# Patient Record
Sex: Female | Born: 1975 | Race: White | Hispanic: No | Marital: Single | Smoking: Never smoker
Health system: Southern US, Community
[De-identification: ages and names within clinical notes are randomized; demographics above are authoritative.]

## PROBLEM LIST (undated history)

## (undated) DIAGNOSIS — G43909 Migraine, unspecified, not intractable, without status migrainosus: Secondary | ICD-10-CM

## (undated) DIAGNOSIS — F419 Anxiety disorder, unspecified: Secondary | ICD-10-CM

## (undated) DIAGNOSIS — K219 Gastro-esophageal reflux disease without esophagitis: Secondary | ICD-10-CM

## (undated) HISTORY — DX: Gastro-esophageal reflux disease without esophagitis: K21.9

## (undated) HISTORY — DX: Migraine, unspecified, not intractable, without status migrainosus: G43.909

## (undated) HISTORY — DX: Anxiety disorder, unspecified: F41.9

---

## 1997-10-30 ENCOUNTER — Emergency Department (HOSPITAL_COMMUNITY): Admission: EM | Admit: 1997-10-30 | Discharge: 1997-10-30 | Payer: Self-pay | Admitting: Emergency Medicine

## 2010-12-24 ENCOUNTER — Emergency Department (HOSPITAL_COMMUNITY)
Admission: EM | Admit: 2010-12-24 | Discharge: 2010-12-25 | Disposition: A | Discharge status: Home / Self Care | Payer: BC Managed Care – PPO | Attending: Emergency Medicine | Admitting: Emergency Medicine

## 2010-12-24 ENCOUNTER — Encounter: Payer: Self-pay | Admitting: *Deleted

## 2010-12-24 ENCOUNTER — Other Ambulatory Visit: Payer: Self-pay

## 2010-12-24 ENCOUNTER — Emergency Department (HOSPITAL_COMMUNITY): Payer: BC Managed Care – PPO

## 2010-12-24 DIAGNOSIS — R109 Unspecified abdominal pain: Secondary | ICD-10-CM | POA: Insufficient documentation

## 2010-12-24 DIAGNOSIS — R059 Cough, unspecified: Secondary | ICD-10-CM | POA: Insufficient documentation

## 2010-12-24 DIAGNOSIS — R509 Fever, unspecified: Secondary | ICD-10-CM | POA: Insufficient documentation

## 2010-12-24 DIAGNOSIS — R339 Retention of urine, unspecified: Secondary | ICD-10-CM | POA: Insufficient documentation

## 2010-12-24 DIAGNOSIS — R05 Cough: Secondary | ICD-10-CM | POA: Insufficient documentation

## 2010-12-24 DIAGNOSIS — R5381 Other malaise: Secondary | ICD-10-CM | POA: Insufficient documentation

## 2010-12-24 DIAGNOSIS — J3489 Other specified disorders of nose and nasal sinuses: Secondary | ICD-10-CM | POA: Insufficient documentation

## 2010-12-24 DIAGNOSIS — R5383 Other fatigue: Secondary | ICD-10-CM | POA: Insufficient documentation

## 2010-12-24 DIAGNOSIS — R079 Chest pain, unspecified: Secondary | ICD-10-CM | POA: Insufficient documentation

## 2010-12-24 DIAGNOSIS — N390 Urinary tract infection, site not specified: Secondary | ICD-10-CM

## 2010-12-24 LAB — URINALYSIS, ROUTINE W REFLEX MICROSCOPIC
Bilirubin Urine: NEGATIVE
Glucose, UA: NEGATIVE mg/dL
Ketones, ur: NEGATIVE mg/dL
Protein, ur: NEGATIVE mg/dL
Urobilinogen, UA: 0.2 mg/dL (ref 0.0–1.0)

## 2010-12-24 LAB — URINE MICROSCOPIC-ADD ON

## 2010-12-24 LAB — BASIC METABOLIC PANEL
BUN: 15 mg/dL (ref 6–23)
Creatinine, Ser: 0.7 mg/dL (ref 0.50–1.10)
GFR calc non Af Amer: >90 mL/min (ref >90)
Glucose, Bld: 119 mg/dL — ABNORMAL HIGH (ref 70–99)
Potassium: 3.8 mEq/L (ref 3.5–5.1)

## 2010-12-24 LAB — CBC
HCT: 41.7 % (ref 36.0–46.0)
Hemoglobin: 14.5 g/dL (ref 12.0–15.0)
MCHC: 34.8 g/dL (ref 30.0–36.0)
MCV: 89.9 fL (ref 78.0–100.0)
RDW: 12.6 % (ref 11.5–15.5)

## 2010-12-24 LAB — POCT PREGNANCY, URINE: Preg Test, Ur: NEGATIVE

## 2010-12-24 LAB — POCT I-STAT TROPONIN I: Troponin i, poc: 0 ng/mL (ref 0.00–0.08)

## 2010-12-24 MED ORDER — SODIUM CHLORIDE 0.9 % IV BOLUS (SEPSIS)
1000.0000 mL | Freq: Once | INTRAVENOUS | Status: AC
  Administered 2010-12-24: 1000 mL via INTRAVENOUS

## 2010-12-24 NOTE — ED Notes (Signed)
The pt has had lower back pain also and is having some now.  She has had this period for the past 2 weeks

## 2010-12-24 NOTE — ED Notes (Signed)
The pt says she had the flu 3 weeks ago and today while driving home she became too weak to drive.  She has some chest pain with coughing and she says she has no energy.  She keeps her eyes cl;osed when shes talking.

## 2010-12-24 NOTE — ED Provider Notes (Signed)
  I performed a history and physical examination of Frances Horn and discussed her management with Dr. Gwendolyn Grant.  I agree with the history, physical, assessment, and plan of care, with the following exceptions: None  The patient presents for evaluation of general malaise and myalgias along with some chest discomfort and occasional cough. On evaluation she appears mildly uncomfortable, fatigue, with dry mucous membranes and apparent dehydration. Her lung sounds are clear in all fields with good air exchange, and her heart sounds are normal with regular rate and rhythm. Her abdomen is soft and nontender to palpation with normal bowel sounds and no rebound or guarding.  I was present for the following procedures: None Time Spent in Critical Care of the patient: None Time spent in discussions with the patient and family: 5 minutes  Frances Horn   Felisa Bonier, MD 12/24/10 2357

## 2010-12-24 NOTE — ED Notes (Signed)
Pt also c/o having her menses for the past 2 months.

## 2010-12-24 NOTE — ED Provider Notes (Signed)
History     CSN: [id number]  Arrival date & time 12/24/10  9528   First MD Initiated Contact with Patient 12/24/10 1915      Chief Complaint  Patient presents with  . Weakness  . Urinary Retention    (Consider location/radiation/quality/duration/timing/severity/associated sxs/prior treatment) HPI Comments: Recent bout of flu and bronchitis. States urinary retention and suprapubic discomfort today.  Recent treatment of sinus infection with Levaquin and Prednisone.  Patient is a 35 y.o. female presenting with weakness and general illness. The history is provided by the patient. No language interpreter was used.  Weakness The primary symptoms include fever. Primary symptoms do not include headaches, syncope, nausea or vomiting. The symptoms began more than 1 week ago. The symptoms are unchanged. The neurological symptoms are focal. Context: No specific context.  Additional symptoms include weakness (generalized). Additional symptoms do not include pain or vertigo.  Illness  Associated symptoms include a fever, congestion (previous, resovled now) and cough. Pertinent negatives include no abdominal pain, no nausea, no vomiting, no headaches, no rhinorrhea, no sore throat and no rash.    Past Medical History  Diagnosis Date  . Asthma     No past surgical history on file.  No family history on file.  History  Substance Use Topics  . Smoking status: Not on file  . Smokeless tobacco: Not on file  . Alcohol Use:     OB History    Grav Para Term Preterm Abortions TAB SAB Ect Mult Living                  Review of Systems  Constitutional: Positive for fever and chills.  HENT: Positive for congestion (previous, resovled now). Negative for sore throat, rhinorrhea and trouble swallowing.   Respiratory: Positive for cough. Negative for shortness of breath.   Cardiovascular: Positive for chest pain (pressure/burnig sensation described in entire chest). Negative for syncope.    Gastrointestinal: Negative for nausea, vomiting and abdominal pain.  Genitourinary: Negative for dysuria and frequency.       Mild urinary retention today  Skin: Negative for rash.  Neurological: Positive for weakness (generalized). Negative for vertigo and headaches.  All other systems reviewed and are negative.    Allergies  Erythromycin and Nabumetone  Home Medications   Current Outpatient Rx  Name Route Sig Dispense Refill  . CALCIUM CARBONATE ANTACID 500 MG PO CHEW Oral Chew 2 tablets by mouth daily.      Marland Kitchen TAGAMET PO Oral Take 1 tablet by mouth daily as needed. For heartburn     . DIPHENHYDRAMINE-APAP (SLEEP) 25-500 MG PO TABS Oral Take 2 tablets by mouth at bedtime as needed. For pain/sleep     . HYDROCODONE-HOMATROPINE 5-1.5 MG/5ML PO SYRP Oral Take 5 mLs by mouth every 6 (six) hours as needed. For cough     . LEVOFLOXACIN 250 MG PO TABS Oral Take 750 mg by mouth daily.      Marland Kitchen PREDNISONE 10 MG PO TABS Oral Take 10-40 mg by mouth daily. Take 40 mg for 2 days,30 mg for 2 days, 20 mg for 2 days, then 10 mg for 2 days then stop. Started 12/22/2010     . PSEUDOEPHEDRINE-GUAIFENESIN 60-600 MG PO TB12 Oral Take 1 tablet by mouth every 12 (twelve) hours.        BP 122/78  Pulse 99  Temp(Src) 98.9 F (37.2 C) (Oral)  SpO2 96%  Physical Exam  Nursing note and vitals reviewed. Constitutional: She is oriented to person,  place, and time. She appears well-developed and well-nourished. No distress.  HENT:  Head: Normocephalic and atraumatic.  Eyes: EOM are normal. Pupils are equal, round, and reactive to light.  Neck: Normal range of motion. Neck supple.  Cardiovascular: Normal rate and regular rhythm.  Exam reveals no friction rub.   No murmur heard. Pulmonary/Chest: Effort normal and breath sounds normal. No respiratory distress. She has no wheezes. She has no rales.  Abdominal: Soft. She exhibits no distension. There is no tenderness. There is no rebound.  Musculoskeletal:  Normal range of motion. She exhibits no edema.  Neurological: She is alert and oriented to person, place, and time.  Skin: She is not diaphoretic.   EKG with normal sinus rhythm, no concern for STEMI.  ED Course  Procedures (including critical care time)  Labs Reviewed  URINALYSIS, ROUTINE W REFLEX MICROSCOPIC - Abnormal; Notable for the following:    pH 8.5 (*)    Hgb urine dipstick TRACE (*)    Leukocytes, UA TRACE (*)    All other components within normal limits  CBC - Abnormal; Notable for the following:    WBC 13.5 (*)    All other components within normal limits  BASIC METABOLIC PANEL - Abnormal; Notable for the following:    Sodium 134 (*)    Glucose, Bld 119 (*)    All other components within normal limits  POCT PREGNANCY, URINE  URINE MICROSCOPIC-ADD ON  POCT I-STAT TROPONIN I  POCT PREGNANCY, URINE  GRAM STAIN  URINE CULTURE  MONONUCLEOSIS SCREEN  I-STAT TROPONIN I   Dg Chest 2 View  12/24/2010  *RADIOLOGY REPORT*  Clinical Data: Shortness of breath.  Cough.  Malaise.  Asthma.  CHEST - 2 VIEW  Comparison:  None.  Findings:  The heart size and mediastinal contours are within normal limits.  Both lungs are clear.  The visualized skeletal structures are unremarkable.  IMPRESSION: No active cardiopulmonary disease.  Original Report Authenticated By: Danae Orleans, M.D.     1. UTI (lower urinary tract infection)   2. Malaise       MDM  38F p/w urinary retention and general malaise. Has had recent bout of flu, sinusitis, and bronchitis. Recently given Levaquin and prednisone for sinusitis. Continued generalized weakness. Today having urinary retention and mild suprapubic pain. Denies N/V, cough. Associated burning/pressure like chest pain, dizziness. Afebrile for past several days. AFVSS on arrival. Exam with mild suprapubic abdominal tenderness, otherwise normal. No concern for otitis, sinusitis. Lungs clear, heart sounds normal.  Labs sent, fluids given. CXR  negative for PNA. Patient's labs show mild UTI, however on repeat exam continued pain and ill-appearance. EKG and troponin checked to rule out possible myocarditis. Repeat IVF bolus given. EKG with NSR, no ischemic changes. Troponin negative.  UTI present on labs, will give Rx for bactrim and discharge home with instructions to f/u with PCP for repeat exam and for monospot results.        Elwin Mocha, MD 12/25/10 [phone]

## 2010-12-24 NOTE — ED Notes (Signed)
The pt is c/o an inability to void for the past 6 hours  Except for small dribbles

## 2010-12-24 NOTE — ED Notes (Signed)
To ed for eval of general weakness and difficulty with urination. States she had flu and bronchitis over the past cple weeks. Urinary symptoms started today

## 2010-12-24 NOTE — ED Notes (Signed)
EKG TAKEN-NO OLD EKG, NEW EKG GIVEN TO DR Gwendolyn Grant

## 2010-12-25 MED ORDER — SULFAMETHOXAZOLE-TRIMETHOPRIM 800-160 MG PO TABS: 1.0000 | ORAL_TABLET | Freq: Two times a day (BID) | ORAL | Status: AC

## 2010-12-25 MED ORDER — SULFAMETHOXAZOLE-TMP DS 800-160 MG PO TABS
1.0000 | ORAL_TABLET | Freq: Once | ORAL | Status: AC
  Administered 2010-12-25: 1 via ORAL
  Filled 2010-12-25: qty 1

## 2010-12-29 NOTE — ED Provider Notes (Signed)
Evaluation and management procedures were performed by the resident physician under my supervision/collaboration.  I evaluated this patient face-to-face at the time of encounter.  Please see my note dated at that time.  Felisa Bonier, MD 12/29/10 1520

## 2012-08-31 IMAGING — CR DG CHEST 2V
2 series · 2 of 2 positions shown · non-contrast
Comparison: None.

CLINICAL DATA: Shortness of breath.  Cough.  Malaise.  Asthma.

CHEST - 2 VIEW

[w chest pa]
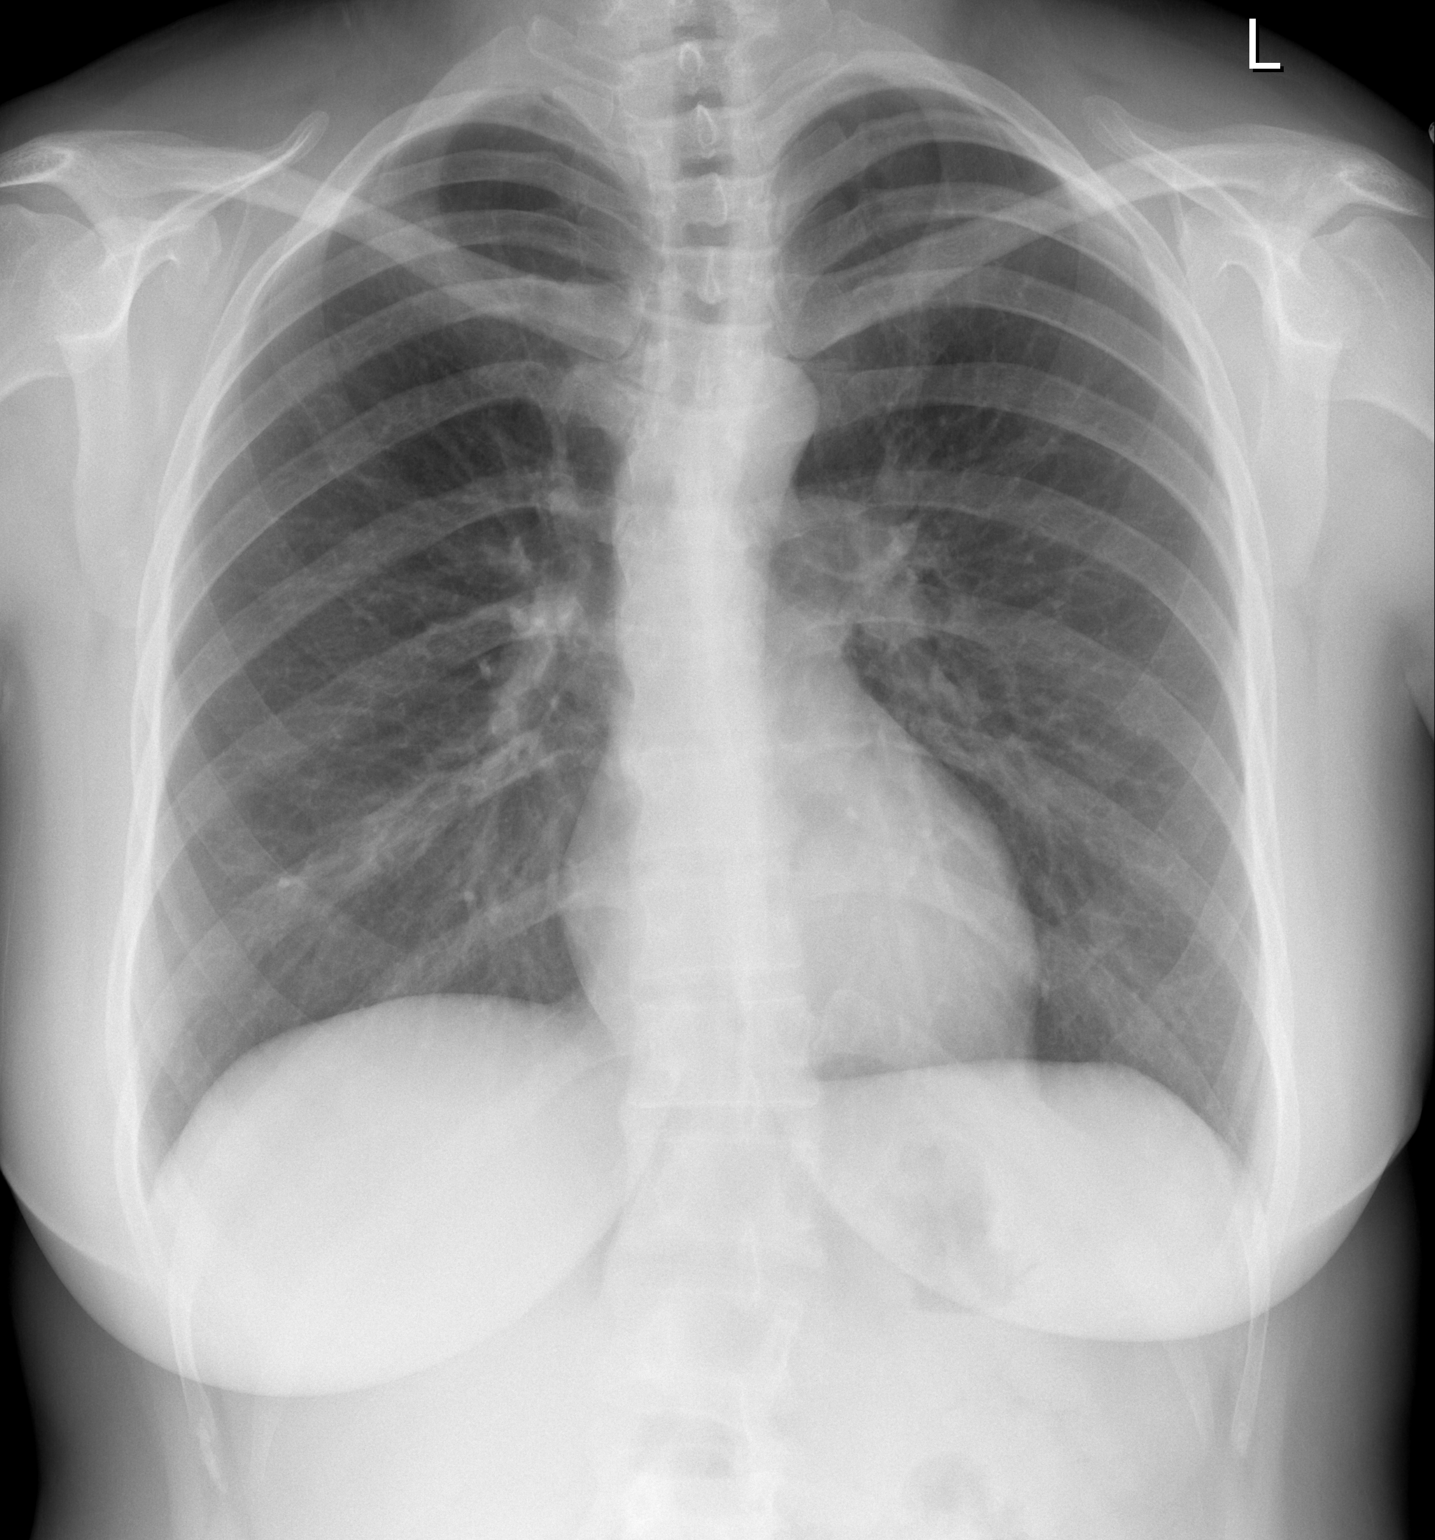

[w chest lat]
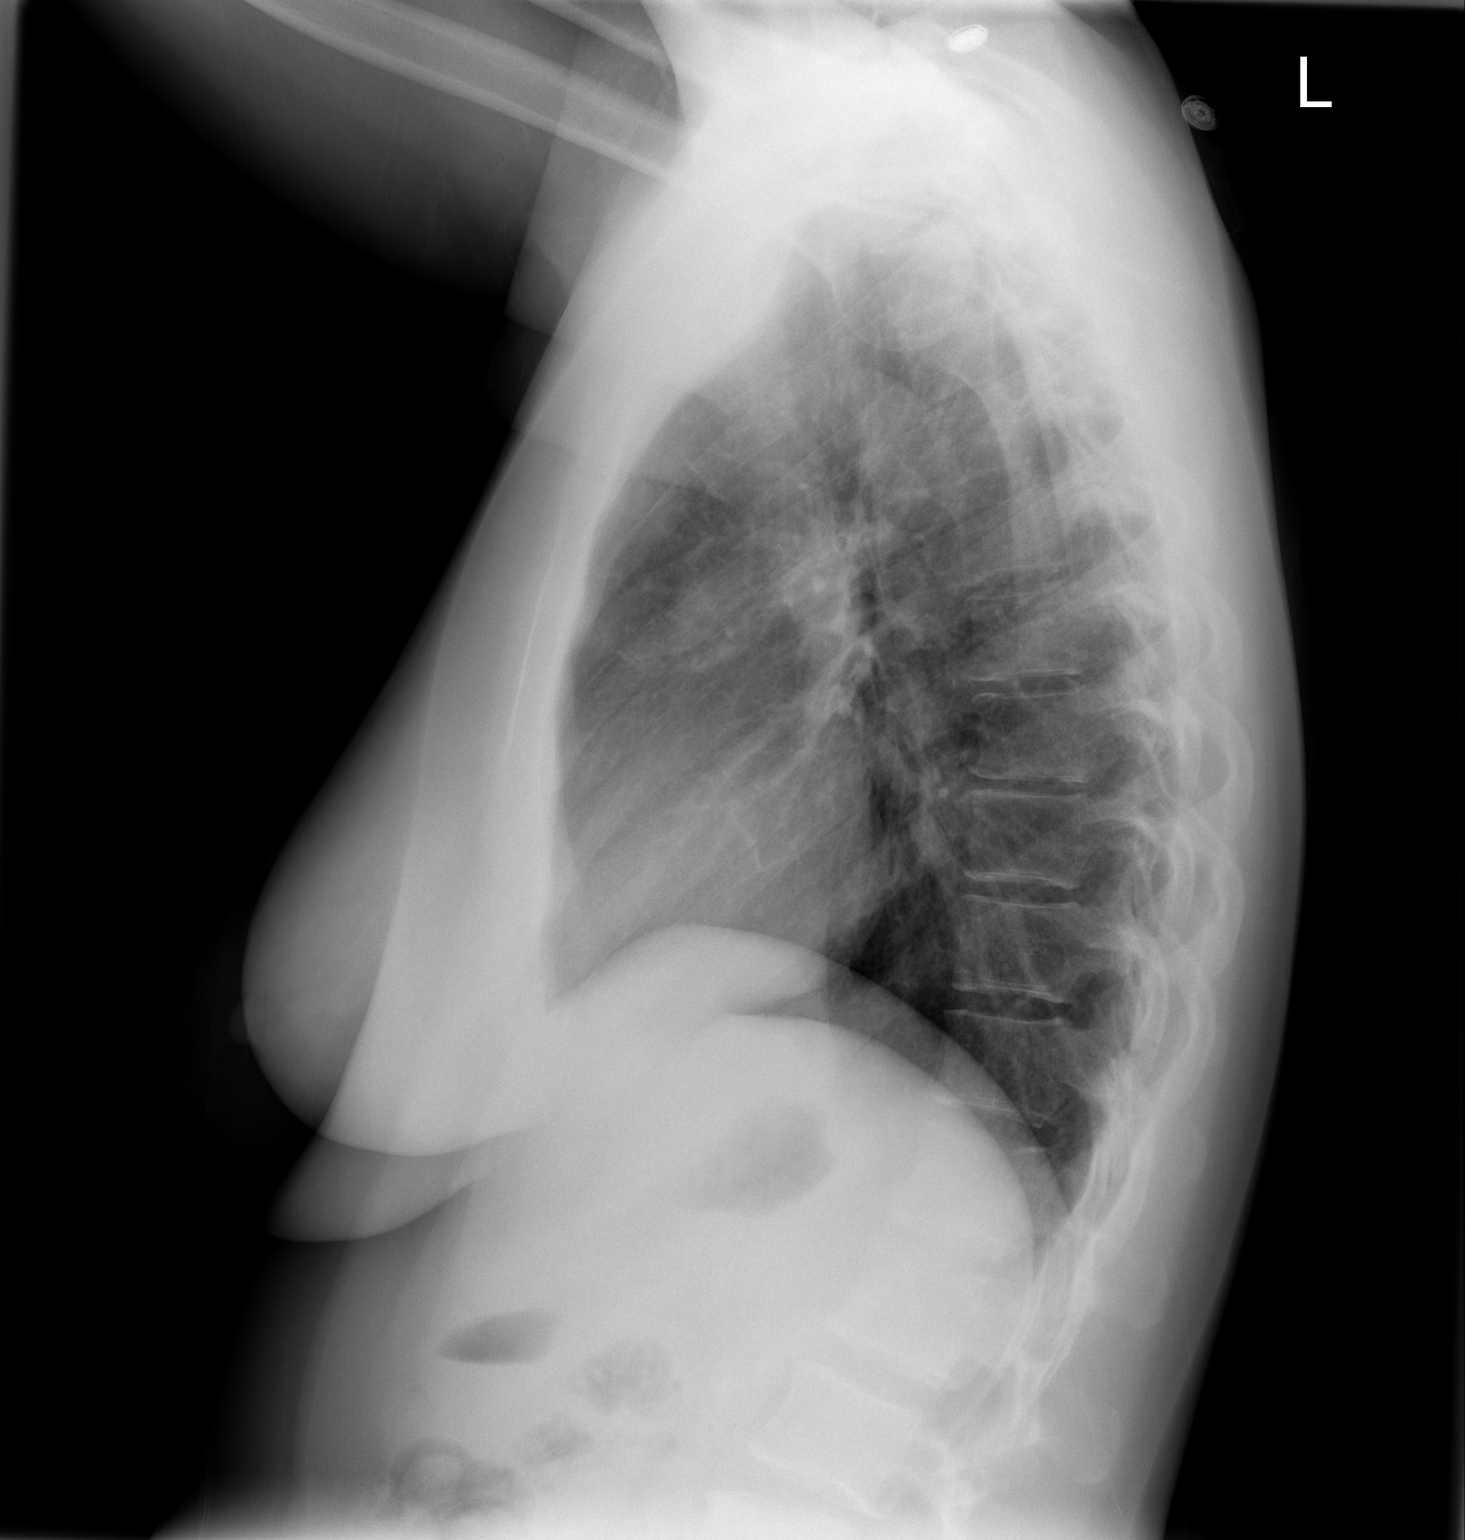

[2 of 2 positions shown; findings below may reference images not displayed]

FINDINGS: The heart size and mediastinal contours are within
normal limits.  Both lungs are clear.  The visualized skeletal
structures are unremarkable.
IMPRESSION: No active cardiopulmonary disease.

## 2014-10-01 DIAGNOSIS — Z91018 Allergy to other foods: Secondary | ICD-10-CM

## 2014-10-01 DIAGNOSIS — J309 Allergic rhinitis, unspecified: Secondary | ICD-10-CM | POA: Insufficient documentation

## 2014-10-01 DIAGNOSIS — J45909 Unspecified asthma, uncomplicated: Secondary | ICD-10-CM | POA: Insufficient documentation

## 2015-04-09 ENCOUNTER — Encounter: Payer: Self-pay | Admitting: Allergy and Immunology

## 2015-04-09 ENCOUNTER — Ambulatory Visit (INDEPENDENT_AMBULATORY_CARE_PROVIDER_SITE_OTHER): Payer: BLUE CROSS/BLUE SHIELD | Admitting: Allergy and Immunology

## 2015-04-09 VITALS — BP 94/72 | HR 72 | Resp 18 | Ht 60.43 in | Wt 187.4 lb

## 2015-04-09 DIAGNOSIS — J4520 Mild intermittent asthma, uncomplicated: Secondary | ICD-10-CM

## 2015-04-09 DIAGNOSIS — J309 Allergic rhinitis, unspecified: Secondary | ICD-10-CM

## 2015-04-09 DIAGNOSIS — J019 Acute sinusitis, unspecified: Secondary | ICD-10-CM | POA: Diagnosis not present

## 2015-04-09 DIAGNOSIS — H1010 Acute atopic conjunctivitis, unspecified eye: Secondary | ICD-10-CM

## 2015-04-09 NOTE — Progress Notes (Signed)
Follow-up Note  Referring Provider: No ref. provider found Primary Provider: Paulina Fusi, MD Date of Office Visit: 04/09/2015  Subjective:   Frances Horn (DOB: [birth year]) is a 40 y.o. female who [facility] in re-evaluation of the following:  HPI Comments: Frances Horn presents this clinic in evaluation of her asthma and allergic rhinitis. She stopped all of her medications including her Asmanex and Nasonex after resolution of last spring and is done very well for the remainder of the year without any episodes of significant respiratory tract symptoms other than one episode of "bronchitis" associated with a fever requiring her to get an antibiotic and a steroid shot in November. She can exercise without any difficulty and does not use a short acting bronchodilator.  Unfortunately, this weekend she had rather acute onset of headache and clear rhinorrhea and head fullness and sneezing with slight cough that did not respond to her usual antihistamines and decongestants. Her nose stop running yesterday and now she just has lots of postnasal drip. Her airway feels very raw. Her daughters at home with a similar type of issue.     Medication List           ALPRAZolam 0.25 MG tablet  Commonly known as:  XANAX  Take 0.25 mg by mouth at bedtime as needed for anxiety.     calcium carbonate 500 MG chewable tablet  Commonly known as:  TUMS - dosed in mg elemental calcium  Chew 2 tablets by mouth daily. Reported on 04/09/2015     CELEXA PO  Take 1 tablet by mouth daily.     cetirizine 10 MG tablet  Commonly known as:  ZYRTEC  Take 10 mg by mouth daily as needed for allergies. Reported on 04/09/2015     diphenhydramine-acetaminophen 25-500 MG Tabs tablet  Commonly known as:  TYLENOL PM  Take 2 tablets by mouth at bedtime as needed. Reported on 04/09/2015     EPIPEN 2-PAK 0.3 mg/0.3 mL Soaj injection  Generic drug:  EPINEPHrine  Inject 0.3 mg into  the muscle once.     omeprazole 40 MG capsule  Commonly known as:  PRILOSEC  Take 40 mg by mouth daily.     ondansetron 4 MG tablet  Commonly known as:  ZOFRAN  Take 4 mg by mouth every 8 (eight) hours as needed for nausea or vomiting. Reported on 04/09/2015     pseudoephedrine-guaifenesin 60-600 MG 12 hr tablet  Commonly known as:  MUCINEX D  Take 1 tablet by mouth every 12 (twelve) hours. Reported on 04/09/2015     rizatriptan 10 MG tablet  Commonly known as:  MAXALT  Take 10 mg by mouth as needed for migraine. May repeat in 2 hours if needed     TOPAMAX 50 MG tablet  Generic drug:  topiramate  Take 50 mg by mouth 2 (two) times daily.     VENTOLIN HFA 108 (90 Base) MCG/ACT inhaler  Generic drug:  albuterol  Inhale 2 puffs into the lungs every 6 (six) hours as needed for wheezing or shortness of breath. Reported on 04/09/2015        Past Medical History  Diagnosis Date  . Asthma   . Migraine   . GERD (gastroesophageal reflux disease)   . Anxiety     History reviewed. No pertinent past surgical history.  Allergies  Allergen Reactions  . Erythromycin Other (See Comments)    Stomach cramps  . Nabumetone Other (  See Comments)    Couldn't breath    Review of systems negative except as noted in HPI / PMHx or noted below:  Review of Systems  Constitutional: Negative.   HENT: Negative.   Eyes: Negative.   Respiratory: Negative.   Cardiovascular: Negative.   Gastrointestinal: Negative.   Genitourinary: Negative.   Musculoskeletal: Negative.   Skin: Negative.   Neurological: Negative.   Endo/Heme/Allergies: Negative.   Psychiatric/Behavioral: Negative.      Objective:   Filed Vitals:   04/09/15 1151  BP: 94/72  Pulse: 72  Resp: 18   Height: 5' 0.43" (153.5 cm)  Weight: 187 lb 6.3 oz (85 kg)   Physical Exam  Constitutional: She is well-developed, well-nourished, and in no distress.  HENT:  Head: Normocephalic.  Right Ear: Tympanic membrane, external ear  and ear canal normal.  Left Ear: Tympanic membrane, external ear and ear canal normal.  Nose: Mucosal edema (Erythematous) present. No rhinorrhea.  Mouth/Throat: Uvula is midline, oropharynx is clear and moist and mucous membranes are normal. No oropharyngeal exudate.  Eyes: Conjunctivae are normal.  Neck: Trachea normal. No tracheal tenderness present. No tracheal deviation present. No thyromegaly present.  Cardiovascular: Normal rate, regular rhythm, S1 normal, S2 normal and normal heart sounds.   No murmur heard. Pulmonary/Chest: Breath sounds normal. No stridor. No respiratory distress. She has no wheezes. She has no rales.  Musculoskeletal: She exhibits no edema.  Lymphadenopathy:       Head (right side): No tonsillar adenopathy present.       Head (left side): No tonsillar adenopathy present.    She has no cervical adenopathy.  Neurological: She is alert. Gait normal.  Skin: No rash noted. She is not diaphoretic. No erythema. Nails show no clubbing.  Psychiatric: Mood and affect normal.    Diagnostics:    Spirometry was performed and demonstrated an FEV1 of 2.45 at 89 % of predicted.  The patient had an Asthma Control Test with the following results: ACT Total Score: 24.    Assessment and Plan:   1. Asthma, mild intermittent, well-controlled   2. Allergic rhinoconjunctivitis   3. Acute sinusitis, recurrence not specified, unspecified location      1. Prednisone 10 mg one tablet once a day for 4 days only  2. Nasal saline multiple times a day while sick  3. OTC antihistamine - Claritin/Allegra/Zyrtec  4. Over-the-counter Mucinex DM 2 tablets twice a day  5. Ventolin HFA 2 puffs every 4-6 hours if needed. EpiPen if needed  6. OTC Rhinocort one spray each nostril one time per day. Coupon  7. Further treatment for prevention of asthma through the springtime?  8. Contact clinic if plan does not work over the course of the next several days  9. Return to clinic in 6  months or earlier if problem    It appears that Frances Horn has contracted a viral upper respiratory tract infection which I will address with the therapy mentioned above. She will keep in contact with me noting her response as we move forward. Whether or not she is going to require preventative medications for asthma through the spring is still an open question. I've asked her to contact me should she have significant problems while utilizing the plan as we move through the spring. I will see her back in this clinic in 6 months or earlier if there is a problem.  Laurette SchimkeEric Kozlow, MD [facility]

## 2015-04-09 NOTE — Patient Instructions (Addendum)
  1. Prednisone 10 mg one tablet once a day for 4 days only  2. Nasal saline multiple times a day while sick  3. OTC antihistamine - Claritin/Allegra/Zyrtec  4. Over-the-counter Mucinex DM 2 tablets twice a day  5. Ventolin HFA 2 puffs every 4-6 hours if needed. EpiPen if needed  6. OTC Rhinocort one spray each nostril one time per day. Coupon  7. Further treatment for prevention of asthma through the springtime?  8. Contact clinic if plan does not work over the course of the next several days  9. Return to clinic in 6 months or earlier if problem

## 2015-05-10 DIAGNOSIS — J45909 Unspecified asthma, uncomplicated: Secondary | ICD-10-CM | POA: Diagnosis not present

## 2015-05-10 DIAGNOSIS — J019 Acute sinusitis, unspecified: Secondary | ICD-10-CM | POA: Diagnosis not present

## 2015-05-10 DIAGNOSIS — J309 Allergic rhinitis, unspecified: Secondary | ICD-10-CM | POA: Diagnosis not present

## 2015-05-10 DIAGNOSIS — J209 Acute bronchitis, unspecified: Secondary | ICD-10-CM | POA: Diagnosis not present

## 2015-05-13 DIAGNOSIS — G43009 Migraine without aura, not intractable, without status migrainosus: Secondary | ICD-10-CM | POA: Diagnosis not present

## 2015-05-13 DIAGNOSIS — Z6834 Body mass index (BMI) 34.0-34.9, adult: Secondary | ICD-10-CM | POA: Diagnosis not present

## 2015-05-13 DIAGNOSIS — G43719 Chronic migraine without aura, intractable, without status migrainosus: Secondary | ICD-10-CM | POA: Diagnosis not present

## 2015-06-10 DIAGNOSIS — K5732 Diverticulitis of large intestine without perforation or abscess without bleeding: Secondary | ICD-10-CM | POA: Diagnosis not present

## 2015-06-10 DIAGNOSIS — Z79899 Other long term (current) drug therapy: Secondary | ICD-10-CM | POA: Diagnosis not present

## 2015-06-10 DIAGNOSIS — K219 Gastro-esophageal reflux disease without esophagitis: Secondary | ICD-10-CM | POA: Diagnosis not present

## 2015-06-10 DIAGNOSIS — F419 Anxiety disorder, unspecified: Secondary | ICD-10-CM | POA: Diagnosis not present

## 2015-06-10 DIAGNOSIS — K5792 Diverticulitis of intestine, part unspecified, without perforation or abscess without bleeding: Secondary | ICD-10-CM | POA: Diagnosis not present

## 2015-06-11 DIAGNOSIS — Z6836 Body mass index (BMI) 36.0-36.9, adult: Secondary | ICD-10-CM | POA: Diagnosis not present

## 2015-06-11 DIAGNOSIS — K5792 Diverticulitis of intestine, part unspecified, without perforation or abscess without bleeding: Secondary | ICD-10-CM | POA: Diagnosis not present

## 2015-06-13 DIAGNOSIS — K5792 Diverticulitis of intestine, part unspecified, without perforation or abscess without bleeding: Secondary | ICD-10-CM | POA: Diagnosis not present

## 2015-06-13 DIAGNOSIS — Z6837 Body mass index (BMI) 37.0-37.9, adult: Secondary | ICD-10-CM | POA: Diagnosis not present

## 2015-06-13 DIAGNOSIS — R1084 Generalized abdominal pain: Secondary | ICD-10-CM | POA: Diagnosis not present

## 2015-06-13 DIAGNOSIS — R109 Unspecified abdominal pain: Secondary | ICD-10-CM | POA: Diagnosis not present

## 2015-06-13 DIAGNOSIS — E669 Obesity, unspecified: Secondary | ICD-10-CM | POA: Diagnosis not present

## 2015-06-13 DIAGNOSIS — K5732 Diverticulitis of large intestine without perforation or abscess without bleeding: Secondary | ICD-10-CM | POA: Diagnosis not present

## 2015-06-17 DIAGNOSIS — R1032 Left lower quadrant pain: Secondary | ICD-10-CM | POA: Diagnosis not present

## 2015-06-18 DIAGNOSIS — K591 Functional diarrhea: Secondary | ICD-10-CM | POA: Diagnosis not present

## 2015-06-24 DIAGNOSIS — Z01419 Encounter for gynecological examination (general) (routine) without abnormal findings: Secondary | ICD-10-CM | POA: Diagnosis not present

## 2015-06-24 DIAGNOSIS — Z124 Encounter for screening for malignant neoplasm of cervix: Secondary | ICD-10-CM | POA: Diagnosis not present

## 2015-07-02 DIAGNOSIS — R1032 Left lower quadrant pain: Secondary | ICD-10-CM | POA: Diagnosis not present

## 2015-07-31 DIAGNOSIS — K5792 Diverticulitis of intestine, part unspecified, without perforation or abscess without bleeding: Secondary | ICD-10-CM | POA: Diagnosis not present

## 2015-07-31 DIAGNOSIS — R197 Diarrhea, unspecified: Secondary | ICD-10-CM | POA: Diagnosis not present

## 2015-07-31 DIAGNOSIS — K5730 Diverticulosis of large intestine without perforation or abscess without bleeding: Secondary | ICD-10-CM | POA: Diagnosis not present

## 2015-07-31 DIAGNOSIS — K449 Diaphragmatic hernia without obstruction or gangrene: Secondary | ICD-10-CM | POA: Diagnosis not present

## 2015-07-31 DIAGNOSIS — R933 Abnormal findings on diagnostic imaging of other parts of digestive tract: Secondary | ICD-10-CM | POA: Diagnosis not present

## 2015-07-31 DIAGNOSIS — K219 Gastro-esophageal reflux disease without esophagitis: Secondary | ICD-10-CM | POA: Diagnosis not present

## 2015-08-31 ENCOUNTER — Telehealth: Payer: Self-pay | Admitting: Allergy and Immunology

## 2015-09-16 DIAGNOSIS — K5730 Diverticulosis of large intestine without perforation or abscess without bleeding: Secondary | ICD-10-CM | POA: Diagnosis not present

## 2015-11-23 DIAGNOSIS — J019 Acute sinusitis, unspecified: Secondary | ICD-10-CM | POA: Diagnosis not present

## 2015-12-30 DIAGNOSIS — G43119 Migraine with aura, intractable, without status migrainosus: Secondary | ICD-10-CM | POA: Diagnosis not present

## 2016-01-29 DIAGNOSIS — J208 Acute bronchitis due to other specified organisms: Secondary | ICD-10-CM | POA: Diagnosis not present

## 2016-01-29 DIAGNOSIS — Z6835 Body mass index (BMI) 35.0-35.9, adult: Secondary | ICD-10-CM | POA: Diagnosis not present

## 2016-01-29 DIAGNOSIS — R6889 Other general symptoms and signs: Secondary | ICD-10-CM | POA: Diagnosis not present

## 2016-03-06 DIAGNOSIS — J019 Acute sinusitis, unspecified: Secondary | ICD-10-CM | POA: Diagnosis not present

## 2016-05-12 DIAGNOSIS — Z6834 Body mass index (BMI) 34.0-34.9, adult: Secondary | ICD-10-CM | POA: Diagnosis not present

## 2016-05-12 DIAGNOSIS — G43009 Migraine without aura, not intractable, without status migrainosus: Secondary | ICD-10-CM | POA: Diagnosis not present

## 2016-08-15 DIAGNOSIS — Z01419 Encounter for gynecological examination (general) (routine) without abnormal findings: Secondary | ICD-10-CM | POA: Diagnosis not present

## 2016-08-15 DIAGNOSIS — Z1231 Encounter for screening mammogram for malignant neoplasm of breast: Secondary | ICD-10-CM | POA: Diagnosis not present

## 2016-08-15 DIAGNOSIS — Z0000 Encounter for general adult medical examination without abnormal findings: Secondary | ICD-10-CM | POA: Diagnosis not present

## 2016-10-17 DIAGNOSIS — Z1389 Encounter for screening for other disorder: Secondary | ICD-10-CM | POA: Diagnosis not present

## 2016-10-17 DIAGNOSIS — Z6835 Body mass index (BMI) 35.0-35.9, adult: Secondary | ICD-10-CM | POA: Diagnosis not present

## 2016-10-17 DIAGNOSIS — F418 Other specified anxiety disorders: Secondary | ICD-10-CM | POA: Diagnosis not present

## 2016-10-17 DIAGNOSIS — J208 Acute bronchitis due to other specified organisms: Secondary | ICD-10-CM | POA: Diagnosis not present

## 2016-10-31 DIAGNOSIS — Z23 Encounter for immunization: Secondary | ICD-10-CM | POA: Diagnosis not present

## 2016-11-03 DIAGNOSIS — Z1231 Encounter for screening mammogram for malignant neoplasm of breast: Secondary | ICD-10-CM | POA: Diagnosis not present

## 2016-11-18 DIAGNOSIS — L989 Disorder of the skin and subcutaneous tissue, unspecified: Secondary | ICD-10-CM | POA: Diagnosis not present

## 2016-11-18 DIAGNOSIS — R0602 Shortness of breath: Secondary | ICD-10-CM | POA: Diagnosis not present

## 2016-11-18 DIAGNOSIS — Z6836 Body mass index (BMI) 36.0-36.9, adult: Secondary | ICD-10-CM | POA: Diagnosis not present

## 2016-11-18 DIAGNOSIS — J208 Acute bronchitis due to other specified organisms: Secondary | ICD-10-CM | POA: Diagnosis not present

## 2016-11-21 DIAGNOSIS — R0600 Dyspnea, unspecified: Secondary | ICD-10-CM | POA: Diagnosis not present

## 2016-11-22 DIAGNOSIS — R0602 Shortness of breath: Secondary | ICD-10-CM | POA: Diagnosis not present

## 2016-11-22 DIAGNOSIS — R791 Abnormal coagulation profile: Secondary | ICD-10-CM | POA: Diagnosis not present

## 2016-11-22 DIAGNOSIS — R079 Chest pain, unspecified: Secondary | ICD-10-CM | POA: Diagnosis not present

## 2016-11-22 DIAGNOSIS — K5730 Diverticulosis of large intestine without perforation or abscess without bleeding: Secondary | ICD-10-CM | POA: Diagnosis not present

## 2016-12-09 DIAGNOSIS — L918 Other hypertrophic disorders of the skin: Secondary | ICD-10-CM | POA: Diagnosis not present

## 2016-12-09 DIAGNOSIS — L720 Epidermal cyst: Secondary | ICD-10-CM | POA: Diagnosis not present

## 2017-03-04 DIAGNOSIS — K5732 Diverticulitis of large intestine without perforation or abscess without bleeding: Secondary | ICD-10-CM | POA: Diagnosis not present

## 2017-07-21 DIAGNOSIS — J209 Acute bronchitis, unspecified: Secondary | ICD-10-CM | POA: Diagnosis not present

## 2017-10-14 DIAGNOSIS — Z6837 Body mass index (BMI) 37.0-37.9, adult: Secondary | ICD-10-CM | POA: Diagnosis not present

## 2017-10-14 DIAGNOSIS — J208 Acute bronchitis due to other specified organisms: Secondary | ICD-10-CM | POA: Diagnosis not present

## 2017-11-07 DIAGNOSIS — Z1231 Encounter for screening mammogram for malignant neoplasm of breast: Secondary | ICD-10-CM | POA: Diagnosis not present

## 2017-11-16 DIAGNOSIS — R928 Other abnormal and inconclusive findings on diagnostic imaging of breast: Secondary | ICD-10-CM | POA: Diagnosis not present

## 2017-11-16 DIAGNOSIS — R922 Inconclusive mammogram: Secondary | ICD-10-CM | POA: Diagnosis not present

## 2017-12-11 DIAGNOSIS — Z01419 Encounter for gynecological examination (general) (routine) without abnormal findings: Secondary | ICD-10-CM | POA: Diagnosis not present

## 2017-12-11 DIAGNOSIS — Z0000 Encounter for general adult medical examination without abnormal findings: Secondary | ICD-10-CM | POA: Diagnosis not present

## 2017-12-22 DIAGNOSIS — R509 Fever, unspecified: Secondary | ICD-10-CM | POA: Diagnosis not present

## 2017-12-22 DIAGNOSIS — J0100 Acute maxillary sinusitis, unspecified: Secondary | ICD-10-CM | POA: Diagnosis not present

## 2018-01-23 DIAGNOSIS — R1011 Right upper quadrant pain: Secondary | ICD-10-CM | POA: Diagnosis not present

## 2018-01-23 DIAGNOSIS — J208 Acute bronchitis due to other specified organisms: Secondary | ICD-10-CM | POA: Diagnosis not present

## 2018-01-23 DIAGNOSIS — J019 Acute sinusitis, unspecified: Secondary | ICD-10-CM | POA: Diagnosis not present

## 2018-03-24 DIAGNOSIS — Z23 Encounter for immunization: Secondary | ICD-10-CM | POA: Diagnosis not present

## 2018-03-24 DIAGNOSIS — M79642 Pain in left hand: Secondary | ICD-10-CM | POA: Diagnosis not present

## 2018-03-24 DIAGNOSIS — R0781 Pleurodynia: Secondary | ICD-10-CM | POA: Diagnosis not present

## 2018-03-24 DIAGNOSIS — S60222A Contusion of left hand, initial encounter: Secondary | ICD-10-CM | POA: Diagnosis not present

## 2018-03-24 DIAGNOSIS — S022XXA Fracture of nasal bones, initial encounter for closed fracture: Secondary | ICD-10-CM | POA: Diagnosis not present

## 2018-03-24 DIAGNOSIS — R079 Chest pain, unspecified: Secondary | ICD-10-CM | POA: Diagnosis not present

## 2018-03-24 DIAGNOSIS — M25552 Pain in left hip: Secondary | ICD-10-CM | POA: Diagnosis not present

## 2018-03-24 DIAGNOSIS — R51 Headache: Secondary | ICD-10-CM | POA: Diagnosis not present

## 2018-03-24 DIAGNOSIS — S0121XA Laceration without foreign body of nose, initial encounter: Secondary | ICD-10-CM | POA: Diagnosis not present

## 2018-03-24 DIAGNOSIS — S6991XA Unspecified injury of right wrist, hand and finger(s), initial encounter: Secondary | ICD-10-CM | POA: Diagnosis not present

## 2018-03-24 DIAGNOSIS — S0031XA Abrasion of nose, initial encounter: Secondary | ICD-10-CM | POA: Diagnosis not present

## 2018-03-24 DIAGNOSIS — W010XXA Fall on same level from slipping, tripping and stumbling without subsequent striking against object, initial encounter: Secondary | ICD-10-CM | POA: Diagnosis not present

## 2018-03-24 DIAGNOSIS — S60221A Contusion of right hand, initial encounter: Secondary | ICD-10-CM | POA: Diagnosis not present

## 2018-03-26 DIAGNOSIS — S022XXA Fracture of nasal bones, initial encounter for closed fracture: Secondary | ICD-10-CM | POA: Diagnosis not present

## 2018-03-29 DIAGNOSIS — S022XXA Fracture of nasal bones, initial encounter for closed fracture: Secondary | ICD-10-CM | POA: Diagnosis not present

## 2018-03-29 DIAGNOSIS — M950 Acquired deformity of nose: Secondary | ICD-10-CM | POA: Diagnosis not present

## 2018-03-29 DIAGNOSIS — S0992XA Unspecified injury of nose, initial encounter: Secondary | ICD-10-CM | POA: Diagnosis not present

## 2018-03-29 DIAGNOSIS — J342 Deviated nasal septum: Secondary | ICD-10-CM | POA: Diagnosis not present

## 2018-04-04 DIAGNOSIS — Z79891 Long term (current) use of opiate analgesic: Secondary | ICD-10-CM | POA: Diagnosis not present

## 2018-04-04 DIAGNOSIS — S022XXA Fracture of nasal bones, initial encounter for closed fracture: Secondary | ICD-10-CM | POA: Diagnosis not present

## 2018-04-04 DIAGNOSIS — J342 Deviated nasal septum: Secondary | ICD-10-CM | POA: Diagnosis not present

## 2018-04-04 DIAGNOSIS — S0033XA Contusion of nose, initial encounter: Secondary | ICD-10-CM | POA: Diagnosis not present

## 2018-04-04 DIAGNOSIS — S0992XA Unspecified injury of nose, initial encounter: Secondary | ICD-10-CM | POA: Diagnosis not present

## 2018-04-04 DIAGNOSIS — J45909 Unspecified asthma, uncomplicated: Secondary | ICD-10-CM | POA: Diagnosis not present

## 2018-04-04 DIAGNOSIS — Z79899 Other long term (current) drug therapy: Secondary | ICD-10-CM | POA: Diagnosis not present

## 2018-04-04 DIAGNOSIS — J3489 Other specified disorders of nose and nasal sinuses: Secondary | ICD-10-CM | POA: Diagnosis not present

## 2018-04-04 DIAGNOSIS — K219 Gastro-esophageal reflux disease without esophagitis: Secondary | ICD-10-CM | POA: Diagnosis not present

## 2018-04-04 DIAGNOSIS — M950 Acquired deformity of nose: Secondary | ICD-10-CM | POA: Diagnosis not present

## 2018-06-06 DIAGNOSIS — R21 Rash and other nonspecific skin eruption: Secondary | ICD-10-CM | POA: Diagnosis not present

## 2018-10-11 DIAGNOSIS — J45909 Unspecified asthma, uncomplicated: Secondary | ICD-10-CM | POA: Diagnosis not present

## 2018-10-11 DIAGNOSIS — J4530 Mild persistent asthma, uncomplicated: Secondary | ICD-10-CM | POA: Diagnosis not present

## 2018-10-11 DIAGNOSIS — M940 Chondrocostal junction syndrome [Tietze]: Secondary | ICD-10-CM | POA: Diagnosis not present

## 2018-10-17 DIAGNOSIS — J4530 Mild persistent asthma, uncomplicated: Secondary | ICD-10-CM | POA: Diagnosis not present

## 2018-10-17 DIAGNOSIS — J019 Acute sinusitis, unspecified: Secondary | ICD-10-CM | POA: Diagnosis not present

## 2018-10-17 DIAGNOSIS — J309 Allergic rhinitis, unspecified: Secondary | ICD-10-CM | POA: Diagnosis not present

## 2018-10-17 DIAGNOSIS — B9689 Other specified bacterial agents as the cause of diseases classified elsewhere: Secondary | ICD-10-CM | POA: Diagnosis not present

## 2018-12-17 DIAGNOSIS — Z1231 Encounter for screening mammogram for malignant neoplasm of breast: Secondary | ICD-10-CM | POA: Diagnosis not present

## 2018-12-17 DIAGNOSIS — Z0000 Encounter for general adult medical examination without abnormal findings: Secondary | ICD-10-CM | POA: Diagnosis not present

## 2018-12-17 DIAGNOSIS — N816 Rectocele: Secondary | ICD-10-CM | POA: Diagnosis not present

## 2018-12-17 DIAGNOSIS — Z01419 Encounter for gynecological examination (general) (routine) without abnormal findings: Secondary | ICD-10-CM | POA: Diagnosis not present

## 2019-01-07 DIAGNOSIS — Z1231 Encounter for screening mammogram for malignant neoplasm of breast: Secondary | ICD-10-CM | POA: Diagnosis not present

## 2019-02-15 DIAGNOSIS — L720 Epidermal cyst: Secondary | ICD-10-CM | POA: Diagnosis not present

## 2019-03-25 DIAGNOSIS — H00024 Hordeolum internum left upper eyelid: Secondary | ICD-10-CM | POA: Diagnosis not present

## 2019-04-02 DIAGNOSIS — H00024 Hordeolum internum left upper eyelid: Secondary | ICD-10-CM | POA: Diagnosis not present

## 2019-04-29 DIAGNOSIS — H0014 Chalazion left upper eyelid: Secondary | ICD-10-CM | POA: Diagnosis not present

## 2019-08-27 DIAGNOSIS — Z20828 Contact with and (suspected) exposure to other viral communicable diseases: Secondary | ICD-10-CM | POA: Diagnosis not present

## 2019-08-27 DIAGNOSIS — J3489 Other specified disorders of nose and nasal sinuses: Secondary | ICD-10-CM | POA: Diagnosis not present

## 2019-09-11 DIAGNOSIS — Z20822 Contact with and (suspected) exposure to covid-19: Secondary | ICD-10-CM | POA: Diagnosis not present

## 2019-09-20 DIAGNOSIS — B9689 Other specified bacterial agents as the cause of diseases classified elsewhere: Secondary | ICD-10-CM | POA: Diagnosis not present

## 2019-09-20 DIAGNOSIS — J208 Acute bronchitis due to other specified organisms: Secondary | ICD-10-CM | POA: Diagnosis not present

## 2019-09-20 DIAGNOSIS — R6889 Other general symptoms and signs: Secondary | ICD-10-CM | POA: Diagnosis not present

## 2019-09-23 ENCOUNTER — Ambulatory Visit (HOSPITAL_COMMUNITY)
Admission: RE | Admit: 2019-09-23 | Discharge: 2019-09-23 | Disposition: A | Discharge status: Home / Self Care | Payer: BC Managed Care – PPO | Source: Ambulatory Visit | Attending: Pulmonary Disease | Admitting: Pulmonary Disease

## 2019-09-23 ENCOUNTER — Other Ambulatory Visit: Payer: Self-pay | Admitting: Oncology

## 2019-09-23 ENCOUNTER — Telehealth: Payer: Self-pay | Admitting: Oncology

## 2019-09-23 DIAGNOSIS — U071 COVID-19: Secondary | ICD-10-CM

## 2019-09-23 MED ORDER — ALBUTEROL SULFATE HFA 108 (90 BASE) MCG/ACT IN AERS: 2.0000 | INHALATION_SPRAY | Freq: Once | RESPIRATORY_TRACT | Status: DC | PRN

## 2019-09-23 MED ORDER — METHYLPREDNISOLONE SODIUM SUCC 125 MG IJ SOLR: 125.0000 mg | Freq: Once | INTRAMUSCULAR | Status: DC | PRN

## 2019-09-23 MED ORDER — SODIUM CHLORIDE 0.9 % IV SOLN: INTRAVENOUS | Status: DC | PRN

## 2019-09-23 MED ORDER — DIPHENHYDRAMINE HCL 50 MG/ML IJ SOLN: 50.0000 mg | Freq: Once | INTRAMUSCULAR | Status: DC | PRN

## 2019-09-23 MED ORDER — EPINEPHRINE 0.3 MG/0.3ML IJ SOAJ: 0.3000 mg | Freq: Once | INTRAMUSCULAR | Status: DC | PRN

## 2019-09-23 MED ORDER — SODIUM CHLORIDE 0.9 % IV SOLN
1200.0000 mg | Freq: Once | INTRAVENOUS | Status: AC
  Administered 2019-09-23: 1200 mg via INTRAVENOUS

## 2019-09-23 MED ORDER — FAMOTIDINE IN NACL 20-0.9 MG/50ML-% IV SOLN: 20.0000 mg | Freq: Once | INTRAVENOUS | Status: DC | PRN

## 2019-09-23 NOTE — Progress Notes (Signed)
  Diagnosis: COVID-19  Physician: Dr. Wright   Procedure: [facility] Med: casirivimab\imdevimab infusion - Provided patient with casirivimab\imdevimab fact sheet for patients, parents and caregivers prior to infusion.  Complications: No immediate complications noted.  Discharge: Discharged home   Lavante Toso  Bell 09/23/2019   

## 2019-09-23 NOTE — Telephone Encounter (Signed)
I connected by phone with  Frances Horn to discuss the potential use of an new treatment for mild to moderate COVID-19 viral infection in non-hospitalized patients.   This patient is a age/sex that meets the FDA criteria for Emergency Use Authorization of casirivimab\imdevimab.  Has a (+) direct SARS-CoV-2 viral test result 1. Has mild or moderate COVID-19  2. Is ? 44 years of age and weighs ? 40 kg 3. Is NOT hospitalized due to COVID-19 4. Is NOT requiring oxygen therapy or requiring an increase in baseline oxygen flow rate due to COVID-19 5. Is within 10 days of symptom onset 6. Has at least one of the high risk factor(s) for progression to severe COVID-19 and/or hospitalization as defined in EUA. Specific high risk criteria :  Past Medical History:  Diagnosis Date  . Anxiety   . Asthma   . GERD (gastroesophageal reflux disease)   . Migraine   ?   Symptom onset 09/18/2019.    I have spoken and communicated the following to the patient or parent/caregiver:   1. FDA has authorized the emergency use of casirivimab\imdevimab for the treatment of mild to moderate COVID-19 in adults and pediatric patients with positive results of direct SARS-CoV-2 viral testing who are 100 years of age and older weighing at least 40 kg, and who are at high risk for progressing to severe COVID-19 and/or hospitalization.   2. The significant known and potential risks and benefits of casirivimab\imdevimab, and the extent to which such potential risks and benefits are unknown.   3. Information on available alternative treatments and the risks and benefits of those alternatives, including clinical trials.   4. Patients treated with casirivimab\imdevimab should continue to self-isolate and use infection control measures (e.g., wear mask, isolate, social distance, avoid sharing personal items, clean and disinfect "high touch" surfaces, and frequent handwashing) according to CDC guidelines.    5. The patient or  parent/caregiver has the option to accept or refuse casirivimab\imdevimab .   After reviewing this information with the patient, The patient agreed to proceed with receiving casirivimab\imdevimab infusion and will be provided a copy of the Fact sheet prior to receiving the infusion.Frances Horn, AGNP-C [phone] (Infusion Center Hotline)

## 2019-09-23 NOTE — Discharge Instructions (Signed)

## 2019-12-30 DIAGNOSIS — Z13220 Encounter for screening for lipoid disorders: Secondary | ICD-10-CM | POA: Diagnosis not present

## 2019-12-30 DIAGNOSIS — Z833 Family history of diabetes mellitus: Secondary | ICD-10-CM | POA: Diagnosis not present

## 2019-12-30 DIAGNOSIS — Z0000 Encounter for general adult medical examination without abnormal findings: Secondary | ICD-10-CM | POA: Diagnosis not present

## 2019-12-30 DIAGNOSIS — Z1329 Encounter for screening for other suspected endocrine disorder: Secondary | ICD-10-CM | POA: Diagnosis not present

## 2019-12-30 DIAGNOSIS — Z83438 Family history of other disorder of lipoprotein metabolism and other lipidemia: Secondary | ICD-10-CM | POA: Diagnosis not present

## 2019-12-30 DIAGNOSIS — Z01419 Encounter for gynecological examination (general) (routine) without abnormal findings: Secondary | ICD-10-CM | POA: Diagnosis not present

## 2019-12-30 DIAGNOSIS — Z131 Encounter for screening for diabetes mellitus: Secondary | ICD-10-CM | POA: Diagnosis not present

## 2019-12-30 DIAGNOSIS — Z8249 Family history of ischemic heart disease and other diseases of the circulatory system: Secondary | ICD-10-CM | POA: Diagnosis not present

## 2020-01-07 DIAGNOSIS — M5412 Radiculopathy, cervical region: Secondary | ICD-10-CM | POA: Diagnosis not present

## 2020-01-07 DIAGNOSIS — F418 Other specified anxiety disorders: Secondary | ICD-10-CM | POA: Diagnosis not present

## 2020-03-09 DIAGNOSIS — Z1231 Encounter for screening mammogram for malignant neoplasm of breast: Secondary | ICD-10-CM | POA: Diagnosis not present

## 2020-04-01 DIAGNOSIS — J0100 Acute maxillary sinusitis, unspecified: Secondary | ICD-10-CM | POA: Diagnosis not present

## 2020-04-01 DIAGNOSIS — J324 Chronic pansinusitis: Secondary | ICD-10-CM | POA: Diagnosis not present

## 2020-09-05 DIAGNOSIS — R519 Headache, unspecified: Secondary | ICD-10-CM | POA: Diagnosis not present

## 2020-09-17 DIAGNOSIS — G43919 Migraine, unspecified, intractable, without status migrainosus: Secondary | ICD-10-CM | POA: Diagnosis not present

## 2020-11-23 DIAGNOSIS — R102 Pelvic and perineal pain: Secondary | ICD-10-CM | POA: Diagnosis not present

## 2020-11-23 DIAGNOSIS — N816 Rectocele: Secondary | ICD-10-CM | POA: Diagnosis not present

## 2021-01-08 DIAGNOSIS — H1045 Other chronic allergic conjunctivitis: Secondary | ICD-10-CM | POA: Diagnosis not present

## 2021-01-12 DIAGNOSIS — J208 Acute bronchitis due to other specified organisms: Secondary | ICD-10-CM | POA: Diagnosis not present

## 2021-01-12 DIAGNOSIS — T782XXA Anaphylactic shock, unspecified, initial encounter: Secondary | ICD-10-CM | POA: Diagnosis not present

## 2021-01-12 DIAGNOSIS — B9689 Other specified bacterial agents as the cause of diseases classified elsewhere: Secondary | ICD-10-CM | POA: Diagnosis not present

## 2021-01-12 DIAGNOSIS — J019 Acute sinusitis, unspecified: Secondary | ICD-10-CM | POA: Diagnosis not present

## 2021-02-08 DIAGNOSIS — Z20828 Contact with and (suspected) exposure to other viral communicable diseases: Secondary | ICD-10-CM | POA: Diagnosis not present

## 2021-02-08 DIAGNOSIS — J111 Influenza due to unidentified influenza virus with other respiratory manifestations: Secondary | ICD-10-CM | POA: Diagnosis not present

## 2021-02-08 DIAGNOSIS — J209 Acute bronchitis, unspecified: Secondary | ICD-10-CM | POA: Diagnosis not present

## 2021-02-22 DIAGNOSIS — G43919 Migraine, unspecified, intractable, without status migrainosus: Secondary | ICD-10-CM | POA: Diagnosis not present

## 2021-02-22 DIAGNOSIS — Z01419 Encounter for gynecological examination (general) (routine) without abnormal findings: Secondary | ICD-10-CM | POA: Diagnosis not present

## 2021-02-25 DIAGNOSIS — Z01419 Encounter for gynecological examination (general) (routine) without abnormal findings: Secondary | ICD-10-CM | POA: Diagnosis not present

## 2021-02-25 DIAGNOSIS — Z13220 Encounter for screening for lipoid disorders: Secondary | ICD-10-CM | POA: Diagnosis not present

## 2021-02-25 DIAGNOSIS — Z1329 Encounter for screening for other suspected endocrine disorder: Secondary | ICD-10-CM | POA: Diagnosis not present

## 2021-02-25 DIAGNOSIS — Z131 Encounter for screening for diabetes mellitus: Secondary | ICD-10-CM | POA: Diagnosis not present

## 2021-04-01 DIAGNOSIS — J069 Acute upper respiratory infection, unspecified: Secondary | ICD-10-CM | POA: Diagnosis not present

## 2021-04-01 DIAGNOSIS — Z20828 Contact with and (suspected) exposure to other viral communicable diseases: Secondary | ICD-10-CM | POA: Diagnosis not present

## 2021-04-01 DIAGNOSIS — R059 Cough, unspecified: Secondary | ICD-10-CM | POA: Diagnosis not present

## 2021-04-05 DIAGNOSIS — Z1231 Encounter for screening mammogram for malignant neoplasm of breast: Secondary | ICD-10-CM | POA: Diagnosis not present

## 2021-04-22 DIAGNOSIS — R922 Inconclusive mammogram: Secondary | ICD-10-CM | POA: Diagnosis not present

## 2021-04-22 DIAGNOSIS — N6011 Diffuse cystic mastopathy of right breast: Secondary | ICD-10-CM | POA: Diagnosis not present

## 2021-04-22 DIAGNOSIS — R928 Other abnormal and inconclusive findings on diagnostic imaging of breast: Secondary | ICD-10-CM | POA: Diagnosis not present

## 2021-04-27 DIAGNOSIS — J45901 Unspecified asthma with (acute) exacerbation: Secondary | ICD-10-CM | POA: Diagnosis not present

## 2021-10-09 DIAGNOSIS — J029 Acute pharyngitis, unspecified: Secondary | ICD-10-CM | POA: Diagnosis not present

## 2021-10-09 DIAGNOSIS — R0981 Nasal congestion: Secondary | ICD-10-CM | POA: Diagnosis not present

## 2021-10-09 DIAGNOSIS — M791 Myalgia, unspecified site: Secondary | ICD-10-CM | POA: Diagnosis not present

## 2021-10-09 DIAGNOSIS — J019 Acute sinusitis, unspecified: Secondary | ICD-10-CM | POA: Diagnosis not present

## 2021-10-09 DIAGNOSIS — R051 Acute cough: Secondary | ICD-10-CM | POA: Diagnosis not present

## 2021-10-09 DIAGNOSIS — R509 Fever, unspecified: Secondary | ICD-10-CM | POA: Diagnosis not present

## 2021-10-14 DIAGNOSIS — H25813 Combined forms of age-related cataract, bilateral: Secondary | ICD-10-CM | POA: Diagnosis not present

## 2021-10-22 DIAGNOSIS — N939 Abnormal uterine and vaginal bleeding, unspecified: Secondary | ICD-10-CM | POA: Diagnosis not present

## 2021-10-27 DIAGNOSIS — H9203 Otalgia, bilateral: Secondary | ICD-10-CM | POA: Diagnosis not present

## 2021-10-27 DIAGNOSIS — G43919 Migraine, unspecified, intractable, without status migrainosus: Secondary | ICD-10-CM | POA: Diagnosis not present

## 2021-10-28 DIAGNOSIS — G43009 Migraine without aura, not intractable, without status migrainosus: Secondary | ICD-10-CM | POA: Diagnosis not present

## 2021-10-29 DIAGNOSIS — H25811 Combined forms of age-related cataract, right eye: Secondary | ICD-10-CM | POA: Diagnosis not present

## 2021-11-17 DIAGNOSIS — H269 Unspecified cataract: Secondary | ICD-10-CM | POA: Diagnosis not present

## 2021-11-17 DIAGNOSIS — H25811 Combined forms of age-related cataract, right eye: Secondary | ICD-10-CM | POA: Diagnosis not present

## 2021-11-17 HISTORY — PX: CATARACT EXTRACTION: SUR2

## 2021-12-01 ENCOUNTER — Ambulatory Visit: Payer: BC Managed Care – PPO | Admitting: Psychiatry

## 2021-12-01 ENCOUNTER — Encounter: Payer: Self-pay | Admitting: Psychiatry

## 2021-12-01 VITALS — BP 124/80 | HR 72 | Ht 61.0 in | Wt 239.0 lb

## 2021-12-01 DIAGNOSIS — G43019 Migraine without aura, intractable, without status migrainosus: Secondary | ICD-10-CM

## 2021-12-01 MED ORDER — CYCLOBENZAPRINE HCL 10 MG PO TABS: 10.0000 mg | ORAL_TABLET | Freq: Three times a day (TID) | ORAL | 6 refills | Status: AC | PRN

## 2021-12-01 MED ORDER — AIMOVIG 140 MG/ML ~~LOC~~ SOAJ: 140.0000 mg | SUBCUTANEOUS | 6 refills | Status: DC

## 2021-12-01 MED ORDER — NURTEC 75 MG PO TBDP: 75.0000 mg | ORAL_TABLET | ORAL | 6 refills | Status: DC | PRN

## 2021-12-01 NOTE — Progress Notes (Signed)
Referring:  Paulina Fusi, MD [address] [place],  [place] [address]  PCP: Paulina Fusi, MD  Neurology was asked to evaluate Frances Horn, a 46 year old female for a chief complaint of headaches.  Our recommendations of care will be communicated by shared medical record.    CC:  headaches  History provided from self  HPI:  Medical co-morbidities: asthma, anxiety, depression, GERD  The patient presents for evaluation of headaches which began several years ago. They have fluctuated over the years and had improved from 2019 until this year. Migraines were daily at one pont, but they have improved in frequency since starting Aimovig. She currently has one migraine per week. Migraines are associated with photophobia, phonophobia, and nausea. They can last for several hours at a time.  Reports significant tension in her neck and shoulders which contributes to her headaches. States she used to do neck PT and take muscle relaxers which did help.  She currently takes ibuprofen as needed, which is not very effective. She has tried multiple triptans without improvement. Her PCP provided Nurtec samples which do help reduce her headaches.   Headache History: Triggers: stress Aura: no Location: unilateral retro-orbital, holocephalic Quality/Description: throbbing, stabbing Associated Symptoms:  Photophobia: yes  Phonophobia: yes  Nausea: yes Worse with activity?: yes Duration of headaches: several hours  Migraine days per month: 4 Headache free days per month: 26  Current Treatment: Abortive ibuprofen  Preventative Aimovig 140 mg monthly  Prior Therapies                                 Rescue: Ibuprofen Toradol - helps Zofran 4 mg PRN Imitrex 100 mg PRN Maxalt 10 mg PRN Nurtec  - helped  Prevention: Topamax/Qudexy 50 mg BID Citalopram Aimovig 140 mg monthly  LABS: CBC    Component Value Date/Time   WBC 13.5 (H) 12/24/2010 2022   RBC  4.64 12/24/2010 2022   HGB 14.5 12/24/2010 2022   HCT 41.7 12/24/2010 2022   PLT 288 12/24/2010 2022   MCV 89.9 12/24/2010 2022   MCH 31.3 12/24/2010 2022   MCHC 34.8 12/24/2010 2022   RDW 12.6 12/24/2010 2022      Latest Ref Rng & Units 12/24/2010    8:22 PM  CMP  Glucose 70 - 99 mg/dL 324   BUN 6 - 23 mg/dL 15   Creatinine 4.01 - 1.10 mg/dL 0.27   Sodium 253 - 664 mEq/L 134   Potassium 3.5 - 5.1 mEq/L 3.8   Chloride 96 - 112 mEq/L 97   CO2 19 - 32 mEq/L 27   Calcium 8.4 - 10.5 mg/dL 9.2      IMAGING:  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 Reported on 04/09/2015     ALPRAZolam (XANAX) 0.25 MG tablet Take 0.25 mg by mouth at bedtime as needed for anxiety.     calcium carbonate (TUMS - DOSED IN MG ELEMENTAL CALCIUM) 500 MG chewable tablet Chew 2 tablets by mouth daily. Reported on 04/09/2015     cetirizine (ZYRTEC) 10 MG tablet Take 10 mg by mouth daily as needed for allergies. Reported on 04/09/2015  Citalopram Hydrobromide (CELEXA PO) Take 1 tablet by mouth daily.     diphenhydramine-acetaminophen (TYLENOL PM) 25-500 MG TABS Take 2 tablets by mouth at bedtime as needed. Reported on 04/09/2015     EPINEPHrine (EPIPEN 2-PAK) 0.3 mg/0.3 mL IJ SOAJ injection Inject 0.3 mg into the muscle once.     estradiol (ESTRACE) 1 MG tablet Take by mouth.     omeprazole (PRILOSEC) 40 MG capsule Take 40 mg by mouth daily.     ondansetron (ZOFRAN) 4 MG tablet Take 4 mg by mouth every 8 (eight) hours as needed for nausea or vomiting. Reported on 04/09/2015     pseudoephedrine-guaifenesin (MUCINEX D) 60-600 MG per tablet Take 1 tablet by mouth every 12 (twelve) hours. Reported on 04/09/2015     No current facility-administered medications on  file prior to visit.     Allergies: Allergies  Allergen Reactions   Erythromycin Other (See Comments)    Stomach cramps   Nabumetone Other (See Comments)    Couldn't breath    Family History: Migraine or other headaches in the family:  mother and daughter have migraines Aneurysms in a first degree relative:  no Brain tumors in the family:  no Other neurological illness in the family:   no  Past Medical History: Past Medical History:  Diagnosis Date   Anxiety    Asthma    GERD (gastroesophageal reflux disease)    Migraine     Past Surgical History Past Surgical History:  Procedure Laterality Date   CATARACT EXTRACTION Right 11/17/2021    Social History: Social History   Tobacco Use   Smoking status: Never   Smokeless tobacco: Never  Substance Use Topics   Alcohol use: No   Drug use: No    ROS: Negative for fevers, chills. Positive for headaches. All other systems reviewed and negative unless stated otherwise in HPI.   Physical Exam:   Vital Signs: BP 124/80   Pulse 72   Ht 5\' 1"  (1.549 m)   Wt 239 lb (108.4 kg)   BMI 45.16 kg/m  GENERAL: well appearing,in no acute distress,alert SKIN:  Color, texture, turgor normal. No rashes or lesions HEAD:  Normocephalic/atraumatic. CV:  RRR RESP: Normal respiratory effort MSK: +tenderness to palpation over L>R neck, and shoulders  NEUROLOGICAL: Mental Status: Alert, oriented to person, place and time,Follows commands Cranial Nerves: PERRL, visual fields intact to confrontation, extraocular movements intact, facial sensation intact, no facial droop or ptosis, hearing grossly intact, no dysarthria Motor: muscle strength 5/5 both upper and lower extremities Reflexes: 2+ throughout Sensation: intact to light touch all 4 extremities Coordination: Finger-to- nose-finger intact bilaterally Gait: normal-based   IMPRESSION: 46 year old female with a history of asthma, anxiety, depression, GERD who presents for  evaluation of migraines. Her neurological exam today is normal. Headache frequency has improved with Aimovig, but she currently does not have an effective rescue medication for breakthrough headaches. Will start Nurtec for migraine rescue. Offered referral to neck PT, which she declined at this time. Will prescribe PRN Flexeril for neck tension as this worked well for her previously.  PLAN: -Prevention: Continue Aimovig 140 mg monthly -Rescue: Start Nurtec 75 mg PRN. Start Flexeril 10 mg TID PRN for muscle tension/spasms  I spent a total of 33 minutes chart reviewing and counseling the patient. Headache education was done. Discussed treatment options including preventive and acute medications, and physical therapy. Written educational materials and patient instructions outlining all of the above were given.  Follow-up: 6 months  Ocie Doyne, MD 12/01/2021   3:32 PM

## 2021-12-02 ENCOUNTER — Telehealth: Payer: Self-pay | Admitting: Neurology

## 2021-12-02 NOTE — Telephone Encounter (Signed)
PA completed on [facility] [id number] Will await determination

## 2021-12-02 NOTE — Telephone Encounter (Signed)
PA approved for the patient immediately  Request Reference Number: [id number]. NURTEC TAB 75MG  ODT is approved through 03/03/2022. Your patient may now fill this prescription and it will be covered.

## 2021-12-10 DIAGNOSIS — H25812 Combined forms of age-related cataract, left eye: Secondary | ICD-10-CM | POA: Diagnosis not present

## 2021-12-10 DIAGNOSIS — H269 Unspecified cataract: Secondary | ICD-10-CM | POA: Diagnosis not present

## 2022-01-28 DIAGNOSIS — R600 Localized edema: Secondary | ICD-10-CM | POA: Diagnosis not present

## 2022-01-28 DIAGNOSIS — R635 Abnormal weight gain: Secondary | ICD-10-CM | POA: Diagnosis not present

## 2022-02-07 DIAGNOSIS — R519 Headache, unspecified: Secondary | ICD-10-CM | POA: Diagnosis not present

## 2022-02-07 DIAGNOSIS — R051 Acute cough: Secondary | ICD-10-CM | POA: Diagnosis not present

## 2022-02-07 DIAGNOSIS — R0981 Nasal congestion: Secondary | ICD-10-CM | POA: Diagnosis not present

## 2022-02-07 DIAGNOSIS — J324 Chronic pansinusitis: Secondary | ICD-10-CM | POA: Diagnosis not present

## 2022-02-14 ENCOUNTER — Telehealth: Payer: Self-pay | Admitting: Pharmacy Technician

## 2022-02-14 NOTE — Telephone Encounter (Signed)
Patient Advocate Encounter   Received notification that prior authorization for Nurtec 75MG dispersible tablets is required.   PA submitted on 02/14/2022 Key [id number]  Status is pending       Lyndel Safe, [place] Patient Advocate Specialist [place] Patient Advocate Team Direct Number: [phone]  Fax: [phone]

## 2022-02-15 NOTE — Telephone Encounter (Signed)
Patient Advocate Encounter  Prior Authorization for Nurtec 75MG dispersible tablets has been approved.    PA# [id number] Effective dates: 02/14/2022 through 02/15/2023      Lyndel Safe, [place] Patient Advocate Specialist [place] Patient Advocate Team Direct Number: [phone]  Fax: [phone]

## 2022-02-25 DIAGNOSIS — Z23 Encounter for immunization: Secondary | ICD-10-CM | POA: Diagnosis not present

## 2022-02-25 DIAGNOSIS — R600 Localized edema: Secondary | ICD-10-CM | POA: Diagnosis not present

## 2022-04-06 DIAGNOSIS — J019 Acute sinusitis, unspecified: Secondary | ICD-10-CM | POA: Diagnosis not present

## 2022-04-06 DIAGNOSIS — M791 Myalgia, unspecified site: Secondary | ICD-10-CM | POA: Diagnosis not present

## 2022-04-06 DIAGNOSIS — J209 Acute bronchitis, unspecified: Secondary | ICD-10-CM | POA: Diagnosis not present

## 2022-04-06 DIAGNOSIS — R509 Fever, unspecified: Secondary | ICD-10-CM | POA: Diagnosis not present

## 2022-04-06 DIAGNOSIS — R051 Acute cough: Secondary | ICD-10-CM | POA: Diagnosis not present

## 2022-04-15 DIAGNOSIS — J208 Acute bronchitis due to other specified organisms: Secondary | ICD-10-CM | POA: Diagnosis not present

## 2022-04-15 DIAGNOSIS — J019 Acute sinusitis, unspecified: Secondary | ICD-10-CM | POA: Diagnosis not present

## 2022-04-15 DIAGNOSIS — B3731 Acute candidiasis of vulva and vagina: Secondary | ICD-10-CM | POA: Diagnosis not present

## 2022-05-18 DIAGNOSIS — R600 Localized edema: Secondary | ICD-10-CM | POA: Diagnosis not present

## 2022-05-18 DIAGNOSIS — Z1231 Encounter for screening mammogram for malignant neoplasm of breast: Secondary | ICD-10-CM | POA: Diagnosis not present

## 2022-05-18 DIAGNOSIS — E785 Hyperlipidemia, unspecified: Secondary | ICD-10-CM | POA: Diagnosis not present

## 2022-05-26 DIAGNOSIS — Z1231 Encounter for screening mammogram for malignant neoplasm of breast: Secondary | ICD-10-CM | POA: Diagnosis not present

## 2022-06-21 NOTE — Patient Instructions (Signed)
Below is our plan:  We will switch Amovig to Ajovy. We will restart rizatriptan (Maxalt) for abortive therapy. You can use with Nurtec or as first line if needed.   Please make sure you are staying well hydrated. I recommend 50-60 ounces daily. Well balanced diet and regular exercise encouraged. Consistent sleep schedule with 6-8 hours recommended.   Please continue follow up with care team as directed.   Follow up with me in 6 months   You may receive a survey regarding today's visit. I encourage you to leave honest feed back as I do use this information to improve patient care. Thank you for seeing me today!   GENERAL HEADACHE INFORMATION:   Natural supplements: Magnesium Oxide or Magnesium Glycinate 500 mg at bed (up to 800 mg daily) Coenzyme Q10 300 mg in AM Vitamin B2- 200 mg twice a day   Add 1 supplement at a time since even natural supplements can have undesirable side effects. You can sometimes buy supplements cheaper (especially Coenzyme Q10) at www.WebmailGuide.co.za or at [facility].  Migraine with aura: There is increased risk for stroke in women with migraine with aura and a contraindication for the combined contraceptive pill for use by women who have migraine with aura. The risk for women with migraine without aura is lower. However other risk factors like smoking are far more likely to increase stroke risk than migraine. There is a recommendation for no smoking and for the use of OCPs without estrogen such as progestogen only pills particularly for women with migraine with aura.Marland Kitchen People who have migraine headaches with auras may be 3 times more likely to have a stroke caused by a blood clot, compared to migraine patients who don't see auras. Women who take hormone-replacement therapy may be 30 percent more likely to suffer a clot-based stroke than women not taking medication containing estrogen. Other risk factors like smoking and high blood pressure may be  much more important.     Vitamins and herbs that show potential:   Magnesium: Magnesium (250 mg twice a day or 500 mg at bed) has a relaxant effect on smooth muscles such as blood vessels. Individuals suffering from frequent or daily headache usually have low magnesium levels which can be increase with daily supplementation of 400-750 mg. Three trials found 40-90% average headache reduction  when used as a preventative. Magnesium may help with headaches are aura, the best evidence for magnesium is for migraine with aura is its thought to stop the cortical spreading depression we believe is the pathophysiology of migraine aura.Magnesium also demonstrated the benefit in menstrually related migraine.  Magnesium is part of the messenger system in the serotonin cascade and it is a good muscle relaxant.  It is also useful for constipation which can be a side effect of other medications used to treat migraine. Good sources include nuts, whole grains, and tomatoes. Side Effects: loose stool/diarrhea  Riboflavin (vitamin B 2) 200 mg twice a day. This vitamin assists nerve cells in the production of ATP a principal energy storing molecule.  It is necessary for many chemical reactions in the body.  There have been at least 3 clinical trials of riboflavin using 400 mg per day all of which suggested that migraine frequency can be decreased.  All 3 trials showed significant improvement in over half of migraine sufferers.  The supplement is found in bread, cereal, milk, meat, and poultry.  Most Americans get more riboflavin than the recommended daily allowance, however riboflavin deficiency is not  necessary for the supplements to help prevent headache. Side effects: energizing, green urine   Coenzyme Q10: This is present in almost all cells in the body and is critical component for the conversion of energy.  Recent studies have shown that a nutritional supplement of CoQ10 can reduce the frequency of migraine attacks by improving the energy  production of cells as with riboflavin.  Doses of 150 mg twice a day have been shown to be effective.   Melatonin: Increasing evidence shows correlation between melatonin secretion and headache conditions.  Melatonin supplementation has decreased headache intensity and duration.  It is widely used as a sleep aid.  Sleep is natures way of dealing with migraine.  A dose of 3 mg is recommended to start for headaches including cluster headache. Higher doses up to 15 mg has been reviewed for use in Cluster headache and have been used. The rationale behind using melatonin for cluster is that many theories regarding the cause of Cluster headache center around the disruption of the normal circadian rhythm in the brain.  This helps restore the normal circadian rhythm.   HEADACHE DIET: Foods and beverages which may trigger migraine Note that only 20% of headache patients are food sensitive. You will know if you are food sensitive if you get a headache consistently 20 minutes to 2 hours after eating a certain food. Only cut out a food if it causes headaches, otherwise you might remove foods you enjoy! What matters most for diet is to eat a well balanced healthy diet full of vegetables and low fat protein, and to not miss meals.   Chocolate, other sweets ALL cheeses except cottage and cream cheese Dairy products, yogurt, sour cream, ice cream Liver Meat extracts (Bovril, Marmite, meat tenderizers) Meats or fish which have undergone aging, fermenting, pickling or smoking. These include: Hotdogs,salami,Lox,sausage, mortadellas,smoked salmon, pepperoni, Pickled herring Pods of broad bean (English beans, Chinese pea pods, [place] (fava) beans, lima and navy beans Ripe avocado, ripe banana Yeast extracts or active yeast preparations such as Brewer's or Fleishman's (commercial bakes goods are permitted) Tomato based foods, pizza (lasagna, etc.)   MSG (monosodium glutamate) is disguised as many things; look for  these common aliases: Monopotassium glutamate Autolysed yeast Hydrolysed protein Sodium caseinate "flavorings" "all natural preservatives" Nutrasweet   Avoid all other foods that convincingly provoke headaches.   Resources: The Dizzy [place] Laundry Your Headache Diet, migrainestrong.com  https://zamora-andrews.com/   Caffeine and Migraine For patients that have migraine, caffeine intake more than 3 days per week can lead to dependency and increased migraine frequency. I would recommend cutting back on your caffeine intake as best you can. The recommended amount of caffeine is 200-300 mg daily, although migraine patients may experience dependency at even lower doses. While you may notice an increase in headache temporarily, cutting back will be helpful for headaches in the long run. For more information on caffeine and migraine, visit: https://americanmigrainefoundation.org/resource-library/caffeine-and-migraine/   Headache Prevention Strategies:   1. Maintain a headache diary; learn to identify and avoid triggers.  - This can be a simple note where you log when you had a headache, associated symptoms, and medications used - There are several smartphone apps developed to help track migraines: Migraine Buddy, Migraine Monitor, Curelator N1-Headache App   Common triggers include: Emotional triggers: Emotional/Upset family or friends Emotional/Upset occupation Business reversal/success Anticipation anxiety Crisis-serious Post-crisis periodNew job/position   Physical triggers: Vacation Day Weekend Strenuous Exercise High Altitude Location New Move Menstrual Day Physical Illness Oversleep/Not enough sleep  Weather changes Light: Photophobia or light sesnitivity treatment involves a balance between desensitization and reduction in overly strong input. Use dark polarized glasses outside, but not inside. Avoid bright or fluorescent light, but  do not dim environment to the point that going into a normally lit room hurts. Consider FL-41 tint lenses, which reduce the most irritating wavelengths without blocking too much light.  These can be obtained at axonoptics.com or theraspecs.com Foods: see list above.   2. Limit use of acute treatments (over-the-counter medications, triptans, etc.) to no more than 2 days per week or 10 days per month to prevent medication overuse headache (rebound headache).     3. Follow a regular schedule (including weekends and holidays): Don't skip meals. Eat a balanced diet. 8 hours of sleep nightly. Minimize stress. Exercise 30 minutes per day. Being overweight is associated with a 5 times increased risk of chronic migraine. Keep well hydrated and drink 6-8 glasses of water per day.   4. Initiate non-pharmacologic measures at the earliest onset of your headache. Rest and quiet environment. Relax and reduce stress. Breathe2Relax is a free app that can instruct you on    some simple relaxtion and breathing techniques. Http://Dawnbuse.com is a    free website that provides teaching videos on relaxation.  Also, there are  many apps that   can be downloaded for "mindful" relaxation.  An app called YOGA NIDRA will help walk you through mindfulness. Another app called Calm can be downloaded to give you a structured mindfulness guide with daily reminders and skill development. Headspace for guided meditation Mindfulness Based Stress Reduction Online Course: www.palousemindfulness.com Cold compresses.   5. Don't wait!! Take the maximum allowable dosage of prescribed medication at the first sign of migraine.   6. Compliance:  Take prescribed medication regularly as directed and at the first sign of a migraine.   7. Communicate:  Call your physician when problems arise, especially if your headaches change, increase in frequency/severity, or become associated with neurological symptoms (weakness, numbness, slurred  speech, etc.). Proceed to emergency room if you experience new or worsening symptoms or symptoms do not resolve, if you have new neurologic symptoms or if headache is severe, or for any concerning symptom.   8. Headache/pain management therapies: Consider various complementary methods, including medication, behavioral therapy, psychological counselling, biofeedback, massage therapy, acupuncture, dry needling, and other modalities.  Such measures may reduce the need for medications. Counseling for pain management, where patients learn to function and ignore/minimize their pain, seems to work very well.   9. Recommend changing family's attention and focus away from patient's headaches. Instead, emphasize daily activities. If first question of day is 'How are your headaches/Do you have a headache today?', then patient will constantly think about headaches, thus making them worse. Goal is to re-direct attention away from headaches, toward daily activities and other distractions.   10. Helpful Websites: www.AmericanHeadacheSociety.org PatentHood.ch www.headaches.org TightMarket.nl www.achenet.org

## 2022-06-21 NOTE — Progress Notes (Signed)
Chief Complaint  Patient presents with   Room 2    Pt is here with her Daughter. Pt states that things have going good since her last appointment. Pt states that she wants to talk about her rescue medication hasn't been working.     HISTORY OF PRESENT ILLNESS:  06/23/22 ALL:  Frances Horn is a 47 y.o. female here today for follow up for migraines. She was seen in consult with Dr Delena Bali 11/2021. Amovig continued and she was started on Nurtec and cyclobenzaprine for abortive therapy. Since, she reports having about 15 headache days a month. She estimated about 4-5 migraines a month. Nurtec has not been as effective. It does help relieve pain some but not completely. Cyclobenzaprine does help with neck tension. She admits she could stand to drink more water.    HISTORY (copied from Dr Quentin Mulling previous note)  Medical co-morbidities: asthma, anxiety, depression, GERD   The patient presents for evaluation of headaches which began several years ago. They have fluctuated over the years and had improved from 2019 until this year. Migraines were daily at one pont, but they have improved in frequency since starting Aimovig. She currently has one migraine per week. Migraines are associated with photophobia, phonophobia, and nausea. They can last for several hours at a time.   Reports significant tension in her neck and shoulders which contributes to her headaches. States she used to do neck PT and take muscle relaxers which did help.   She currently takes ibuprofen as needed, which is not very effective. She has tried multiple triptans without improvement. Her PCP provided Nurtec samples which do help reduce her headaches.     Headache History: Triggers: stress Aura: no Location: unilateral retro-orbital, holocephalic Quality/Description: throbbing, stabbing Associated Symptoms:             Photophobia: yes             Phonophobia: yes             Nausea: yes Worse with activity?:  yes Duration of headaches: several hours   Migraine days per month: 4 Headache free days per month: 26   Current Treatment: Abortive ibuprofen   Preventative Aimovig 140 mg monthly   Prior Therapies                                 Rescue: Ibuprofen Toradol - helps Zofran 4 mg PRN Imitrex 100 mg PRN Maxalt 10 mg PRN Nurtec  - helped   Prevention: Topamax/Qudexy 50 mg BID Citalopram Aimovig 140 mg monthly   REVIEW OF SYSTEMS: Out of a complete 14 system review of symptoms, the patient complains only of the following symptoms, headaches and all other reviewed systems are negative.   ALLERGIES: Allergies  Allergen Reactions   Erythromycin Other (See Comments)    Stomach cramps   Nabumetone Other (See Comments)    Couldn't breath     HOME MEDICATIONS: Outpatient Medications Prior to Visit  Medication Sig Dispense Refill   albuterol (VENTOLIN HFA) 108 (90 BASE) MCG/ACT inhaler Inhale 2 puffs into the lungs every 6 (six) hours as needed for wheezing or shortness of breath. Reported on 04/09/2015     ALPRAZolam (XANAX) 0.25 MG tablet Take 0.25 mg by mouth at bedtime as needed for anxiety.     calcium carbonate (TUMS - DOSED IN MG ELEMENTAL CALCIUM) 500 MG chewable tablet Chew 2 tablets by mouth daily.  Reported on 04/09/2015     cetirizine (ZYRTEC) 10 MG tablet Take 10 mg by mouth daily as needed for allergies. Reported on 04/09/2015     Citalopram Hydrobromide (CELEXA PO) Take 1 tablet by mouth daily.     cyclobenzaprine (FLEXERIL) 10 MG tablet Take 1 tablet (10 mg total) by mouth 3 (three) times daily as needed for muscle spasms. 90 tablet 6   diphenhydramine-acetaminophen (TYLENOL PM) 25-500 MG TABS Take 2 tablets by mouth at bedtime as needed. Reported on 04/09/2015     EPINEPHrine (EPIPEN 2-PAK) 0.3 mg/0.3 mL IJ SOAJ injection Inject 0.3 mg into the muscle once.     estradiol (ESTRACE) 1 MG tablet Take by mouth.     furosemide (LASIX) 10 MG/ML solution Take 20 mg by mouth  daily as needed.     omeprazole (PRILOSEC) 40 MG capsule Take 40 mg by mouth daily.     ondansetron (ZOFRAN) 4 MG tablet Take 4 mg by mouth every 8 (eight) hours as needed for nausea or vomiting. Reported on 04/09/2015     pseudoephedrine-guaifenesin (MUCINEX D) 60-600 MG per tablet Take 1 tablet by mouth every 12 (twelve) hours. Reported on 04/09/2015     Rimegepant Sulfate (NURTEC) 75 MG TBDP Take 75 mg by mouth as needed. 8 tablet 6   Erenumab-aooe (AIMOVIG) 140 MG/ML SOAJ Inject 140 mg into the skin every 30 (thirty) days. 1.12 mL 6   No facility-administered medications prior to visit.     PAST MEDICAL HISTORY: Past Medical History:  Diagnosis Date   Anxiety    Asthma    GERD (gastroesophageal reflux disease)    Migraine      PAST SURGICAL HISTORY: Past Surgical History:  Procedure Laterality Date   CATARACT EXTRACTION Right 11/17/2021     FAMILY HISTORY: Family History  Problem Relation Age of Onset   High blood pressure Mother    Diabetes Mother    Alzheimer's disease Father    Heart Problems Father    High Cholesterol Father    Stroke Father    High blood pressure Brother    Heart Problems Maternal Uncle    Stroke Maternal Uncle    Bladder Cancer Maternal Uncle    Cancer Paternal Aunt    Heart Problems Maternal Grandmother    Stroke Maternal Grandmother    Heart attack Maternal Grandfather    Cancer Paternal Grandmother    Cancer Paternal Grandfather      SOCIAL HISTORY: Social History   Socioeconomic History   Marital status: Single    Spouse name: Not on file   Number of children: Not on file   Years of education: Not on file   Highest education level: Not on file  Occupational History   Not on file  Tobacco Use   Smoking status: Never   Smokeless tobacco: Never  Substance and Sexual Activity   Alcohol use: No   Drug use: No   Sexual activity: Not on file  Other Topics Concern   Not on file  Social History Narrative   Not on file   Social  Determinants of Health   Financial Resource Strain: Not on file  Food Insecurity: Not on file  Transportation Needs: Not on file  Physical Activity: Not on file  Stress: Not on file  Social Connections: Not on file  Intimate Partner Violence: Not on file     PHYSICAL EXAM  Vitals:   06/23/22 1344  BP: (!) 147/102  Pulse: 93  Weight: 244  lb 8 oz (110.9 kg)  Height: 5\' 1"  (1.549 m)   Body mass index is 46.2 kg/m.  Generalized: Well developed, in no acute distress  Cardiology: normal rate and rhythm, no murmur auscultated  Respiratory: clear to auscultation bilaterally    Neurological examination  Mentation: Alert oriented to time, place, history taking. Follows all commands speech and language fluent Cranial nerve II-XII: Pupils were equal round reactive to light. Extraocular movements were full, visual field were full on confrontational test. Facial sensation and strength were normal. Head turning and shoulder shrug  were normal and symmetric. Motor: The motor testing reveals 5 over 5 strength of all 4 extremities. Good symmetric motor tone is noted throughout.  Gait and station: Gait is normal.    DIAGNOSTIC DATA (LABS, IMAGING, TESTING) - I reviewed patient records, labs, notes, testing and imaging myself where available.  Lab Results  Component Value Date   WBC 13.5 (H) 12/24/2010   HGB 14.5 12/24/2010   HCT 41.7 12/24/2010   MCV 89.9 12/24/2010   PLT 288 12/24/2010      Component Value Date/Time   NA 134 (L) 12/24/2010 2022   K 3.8 12/24/2010 2022   CL 97 12/24/2010 2022   CO2 27 12/24/2010 2022   GLUCOSE 119 (H) 12/24/2010 2022   BUN 15 12/24/2010 2022   CREATININE 0.70 12/24/2010 2022   CALCIUM 9.2 12/24/2010 2022   GFRNONAA >90 12/24/2010 2022   GFRAA >90 12/24/2010 2022   No results found for: "CHOL", "HDL", "LDLCALC", "LDLDIRECT", "TRIG", "CHOLHDL" No results found for: "HGBA1C" No results found for: "VITAMINB12" No results found for: "TSH"       No data to display               No data to display           ASSESSMENT AND PLAN  47 y.o. year old female  has a past medical history of Anxiety, Asthma, GERD (gastroesophageal reflux disease), and Migraine. here with    Intractable migraine without aura and without status migrainosus  Monzerat Michaux continues to have about 15 headache days a month and 4-5 migraines. I will switch her to Ajovy for prevention. We will restart rizatriptan for abortive therapy as this seems to have worked well in the past. She may continue Nurtec and cyclobenzaprine for abortive therapy. Appropriate dosing and possible side effects reviewed. Healthy lifestyle habits encouraged. She will follow up with PCP as directed. She will return to see me in 6 months, sooner if needed. She verbalizes understanding and agreement with this plan.   No orders of the defined types were placed in this encounter.    Meds ordered this encounter  Medications   Fremanezumab-vfrm (AJOVY) 225 MG/1.5ML SOAJ    Sig: Inject 225 mg into the skin every 30 (thirty) days.    Dispense:  4.5 mL    Refill:  3    Order Specific Question:   Supervising Provider    Answer:   Anson Fret [[id number]]   rizatriptan (MAXALT) 10 MG tablet    Sig: Take 1 tablet (10 mg total) by mouth as needed for migraine. May repeat in 2 hours if needed    Dispense:  8 tablet    Refill:  11    Order Specific Question:   Supervising Provider    Answer:   Anson Fret [[id number]]     Shawnie Dapper, MSN, FNP-C 06/23/2022, 2:15 PM  [facility] [address], [address]  [place], [place] [address] [phone]

## 2022-06-23 ENCOUNTER — Ambulatory Visit: Payer: BC Managed Care – PPO | Admitting: Family Medicine

## 2022-06-23 ENCOUNTER — Encounter: Payer: Self-pay | Admitting: Family Medicine

## 2022-06-23 VITALS — BP 147/102 | HR 93 | Ht 61.0 in | Wt 244.5 lb

## 2022-06-23 DIAGNOSIS — G43019 Migraine without aura, intractable, without status migrainosus: Secondary | ICD-10-CM

## 2022-06-23 MED ORDER — AJOVY 225 MG/1.5ML ~~LOC~~ SOAJ: 225.0000 mg | SUBCUTANEOUS | 3 refills | Status: DC

## 2022-06-23 MED ORDER — RIZATRIPTAN BENZOATE 10 MG PO TABS: 10.0000 mg | ORAL_TABLET | ORAL | 11 refills | Status: DC | PRN

## 2022-07-02 ENCOUNTER — Telehealth: Payer: Self-pay | Admitting: Pharmacy Technician

## 2022-07-02 ENCOUNTER — Other Ambulatory Visit (HOSPITAL_COMMUNITY): Payer: Self-pay

## 2022-07-02 NOTE — Telephone Encounter (Signed)
Pharmacy Patient Advocate Encounter  Prior Authorization for AJOVY (fremanezumab-vfrm) injection 225MG /1.5ML auto-injectors has been APPROVED by [employer] from 07/02/2022 to 01/01/2023.   PA # PA Case ID #: [id number]

## 2022-07-02 NOTE — Telephone Encounter (Signed)
Pharmacy Patient Advocate Encounter   Received notification from CoverMyMeds that prior authorization for AJOVY (fremanezumab-vfrm) injection 225MG /1.5ML auto-injectors is required/requested.    PA submitted to [facility] via CoverMyMeds Key/confirmation #/EOC [id number] Status is pending

## 2022-07-07 ENCOUNTER — Other Ambulatory Visit: Payer: Self-pay | Admitting: Psychiatry

## 2022-07-08 DIAGNOSIS — Z1231 Encounter for screening mammogram for malignant neoplasm of breast: Secondary | ICD-10-CM | POA: Diagnosis not present

## 2022-07-08 DIAGNOSIS — Z01419 Encounter for gynecological examination (general) (routine) without abnormal findings: Secondary | ICD-10-CM | POA: Diagnosis not present

## 2022-07-08 DIAGNOSIS — Z0000 Encounter for general adult medical examination without abnormal findings: Secondary | ICD-10-CM | POA: Diagnosis not present

## 2022-07-08 DIAGNOSIS — N950 Postmenopausal bleeding: Secondary | ICD-10-CM | POA: Diagnosis not present

## 2022-07-11 NOTE — Telephone Encounter (Signed)
Patient last seen:06/23/22 Follow up scheduled:12/26/22 Last filled: 06/23/22 with refills

## 2022-07-20 DIAGNOSIS — N950 Postmenopausal bleeding: Secondary | ICD-10-CM | POA: Diagnosis not present

## 2022-08-04 DIAGNOSIS — H53149 Visual discomfort, unspecified: Secondary | ICD-10-CM | POA: Diagnosis not present

## 2022-08-04 DIAGNOSIS — R110 Nausea: Secondary | ICD-10-CM | POA: Diagnosis not present

## 2022-08-04 DIAGNOSIS — R519 Headache, unspecified: Secondary | ICD-10-CM | POA: Diagnosis not present

## 2022-12-05 DIAGNOSIS — B9689 Other specified bacterial agents as the cause of diseases classified elsewhere: Secondary | ICD-10-CM | POA: Diagnosis not present

## 2022-12-05 DIAGNOSIS — J019 Acute sinusitis, unspecified: Secondary | ICD-10-CM | POA: Diagnosis not present

## 2022-12-20 ENCOUNTER — Other Ambulatory Visit: Payer: Self-pay

## 2022-12-22 ENCOUNTER — Other Ambulatory Visit (HOSPITAL_COMMUNITY): Payer: Self-pay

## 2022-12-22 ENCOUNTER — Telehealth: Payer: Self-pay

## 2022-12-22 NOTE — Telephone Encounter (Signed)
Called and spoke to pt and she stated had positive response to therapy missing days from work have reduced. Decrease in maxalt use

## 2022-12-22 NOTE — Telephone Encounter (Signed)
  Itis time to renew PA for Ajovy-PT has not been evaluated since starting Ajovy and I do not see any upcoming scheduled appointments-please advise.  It is time to renew PA for Ajovy-pt has not been evaluated since starting the med and has no upcoming appointments scheduled-please advise-also I only answered the questions to get them to populate so we could handle them all at one time. Thanks.

## 2022-12-22 NOTE — Telephone Encounter (Signed)
Pharmacy Patient Advocate Encounter  Received notification from [facility] that Prior Authorization for AJOVY (fremanezumab-vfrm) injection 225MG /1.5ML auto-injectors has been APPROVED from 12/22/2022 to 12/22/2023. Ran test claim, Copay is $24.98. This test claim was processed through [facility]- copay amounts may vary at other pharmacies due to pharmacy/plan contracts, or as the patient moves through the different stages of their insurance plan.   PA #/Case ID/Reference #: PA Case ID #: [id number]

## 2022-12-22 NOTE — Telephone Encounter (Signed)
Pharmacy Patient Advocate Encounter   Received notification from Fax that prior authorization for AJOVY (fremanezumab-vfrm) injection 225MG /1.5ML auto-injectors is required/requested.   Insurance verification completed.   The patient is insured through [facility] .   Per test claim: PA required; PA submitted to above mentioned insurance via CoverMyMeds Key/confirmation #/EOC [id number] Status is pending

## 2022-12-26 ENCOUNTER — Ambulatory Visit: Payer: BC Managed Care – PPO | Admitting: Family Medicine

## 2023-03-03 ENCOUNTER — Other Ambulatory Visit (HOSPITAL_COMMUNITY): Payer: Self-pay

## 2023-03-20 ENCOUNTER — Other Ambulatory Visit: Payer: Self-pay | Admitting: *Deleted

## 2023-03-20 MED ORDER — NURTEC 75 MG PO TBDP: 75.0000 mg | ORAL_TABLET | ORAL | 3 refills | Status: DC | PRN

## 2023-03-23 ENCOUNTER — Other Ambulatory Visit (HOSPITAL_COMMUNITY): Payer: Self-pay

## 2023-03-23 ENCOUNTER — Telehealth: Payer: Self-pay

## 2023-03-23 NOTE — Telephone Encounter (Signed)
 Pharmacy Patient Advocate Encounter   Received notification from Fax that prior authorization for Nurtec 75MG  dispersible tablets is required/requested.   Insurance verification completed.   The patient is insured through [facility] .   Per test claim: PA required; PA submitted to above mentioned insurance via CoverMyMeds Key/confirmation #/EOC [id number] Status is pending

## 2023-03-24 ENCOUNTER — Other Ambulatory Visit (HOSPITAL_COMMUNITY): Payer: Self-pay

## 2023-03-24 DIAGNOSIS — M62830 Muscle spasm of back: Secondary | ICD-10-CM | POA: Diagnosis not present

## 2023-03-24 DIAGNOSIS — F418 Other specified anxiety disorders: Secondary | ICD-10-CM | POA: Diagnosis not present

## 2023-03-24 NOTE — Telephone Encounter (Signed)
 Pharmacy Patient Advocate Encounter  Received notification from [facility] that Prior Authorization for Nurtec 75MG  dispersible tablets has been APPROVED from 03/23/2023 to 03/22/2024. Ran test claim, Copay is $0. This test claim was processed through [facility]- copay amounts may vary at other pharmacies due to pharmacy/plan contracts, or as the patient moves through the different stages of their insurance plan.   PA #/Case ID/Reference #: PA Case ID #: [id number]

## 2023-05-02 DIAGNOSIS — R5383 Other fatigue: Secondary | ICD-10-CM | POA: Diagnosis not present

## 2023-05-02 DIAGNOSIS — R519 Headache, unspecified: Secondary | ICD-10-CM | POA: Diagnosis not present

## 2023-07-13 DIAGNOSIS — Z01419 Encounter for gynecological examination (general) (routine) without abnormal findings: Secondary | ICD-10-CM | POA: Diagnosis not present

## 2023-07-13 DIAGNOSIS — Z1231 Encounter for screening mammogram for malignant neoplasm of breast: Secondary | ICD-10-CM | POA: Diagnosis not present

## 2023-07-17 ENCOUNTER — Telehealth: Payer: Self-pay | Admitting: Family Medicine

## 2023-07-17 NOTE — Patient Instructions (Signed)
 Below is our plan:  We will switch Ajovy  to Emgality . Continue 1 injection every 30 days. We will switch Nurtec to Ubrelvy . Please take 1 tablet at onset of headache. May take 1 additional tablet in 2 hours if needed. Do not take more than 2 tablets in 24 hours or more than 10 in a month. You can use rizatriptan  for intractable headaches.   Please make sure you are staying well hydrated. I recommend 50-60 ounces daily. Well balanced diet and regular exercise encouraged. Consistent sleep schedule with 6-8 hours recommended.   Please continue follow up with care team as directed.   Follow up with me in 6 months   You may receive a survey regarding today's visit. I encourage you to leave honest feed back as I do use this information to improve patient care. Thank you for seeing me today!   GENERAL HEADACHE INFORMATION:   Natural supplements: Magnesium Oxide or Magnesium Glycinate 500 mg at bed (up to 800 mg daily) Coenzyme Q10 300 mg in AM Vitamin B2- 200 mg twice a day   Add 1 supplement at a time since even natural supplements can have undesirable side effects. You can sometimes buy supplements cheaper (especially Coenzyme Q10) at www.WebmailGuide.co.za or at [facility].  Migraine with aura: There is increased risk for stroke in women with migraine with aura and a contraindication for the combined contraceptive pill for use by women who have migraine with aura. The risk for women with migraine without aura is lower. However other risk factors like smoking are far more likely to increase stroke risk than migraine. There is a recommendation for no smoking and for the use of OCPs without estrogen such as progestogen only pills particularly for women with migraine with aura.SABRA People who have migraine headaches with auras may be 3 times more likely to have a stroke caused by a blood clot, compared to migraine patients who don't see auras. Women who take hormone-replacement therapy may be 30 percent more likely  to suffer a clot-based stroke than women not taking medication containing estrogen. Other risk factors like smoking and high blood pressure may be  much more important.    Vitamins and herbs that show potential:   Magnesium: Magnesium (250 mg twice a day or 500 mg at bed) has a relaxant effect on smooth muscles such as blood vessels. Individuals suffering from frequent or daily headache usually have low magnesium levels which can be increase with daily supplementation of 400-750 mg. Three trials found 40-90% average headache reduction  when used as a preventative. Magnesium may help with headaches are aura, the best evidence for magnesium is for migraine with aura is its thought to stop the cortical spreading depression we believe is the pathophysiology of migraine aura.Magnesium also demonstrated the benefit in menstrually related migraine.  Magnesium is part of the messenger system in the serotonin cascade and it is a good muscle relaxant.  It is also useful for constipation which can be a side effect of other medications used to treat migraine. Good sources include nuts, whole grains, and tomatoes. Side Effects: loose stool/diarrhea  Riboflavin (vitamin B 2) 200 mg twice a day. This vitamin assists nerve cells in the production of ATP a principal energy storing molecule.  It is necessary for many chemical reactions in the body.  There have been at least 3 clinical trials of riboflavin using 400 mg per day all of which suggested that migraine frequency can be decreased.  All 3 trials showed significant  improvement in over half of migraine sufferers.  The supplement is found in bread, cereal, milk, meat, and poultry.  Most Americans get more riboflavin than the recommended daily allowance, however riboflavin deficiency is not necessary for the supplements to help prevent headache. Side effects: energizing, green urine   Coenzyme Q10: This is present in almost all cells in the body and is critical component  for the conversion of energy.  Recent studies have shown that a nutritional supplement of CoQ10 can reduce the frequency of migraine attacks by improving the energy production of cells as with riboflavin.  Doses of 150 mg twice a day have been shown to be effective.   Melatonin: Increasing evidence shows correlation between melatonin secretion and headache conditions.  Melatonin supplementation has decreased headache intensity and duration.  It is widely used as a sleep aid.  Sleep is natures way of dealing with migraine.  A dose of 3 mg is recommended to start for headaches including cluster headache. Higher doses up to 15 mg has been reviewed for use in Cluster headache and have been used. The rationale behind using melatonin for cluster is that many theories regarding the cause of Cluster headache center around the disruption of the normal circadian rhythm in the brain.  This helps restore the normal circadian rhythm.   HEADACHE DIET: Foods and beverages which may trigger migraine Note that only 20% of headache patients are food sensitive. You will know if you are food sensitive if you get a headache consistently 20 minutes to 2 hours after eating a certain food. Only cut out a food if it causes headaches, otherwise you might remove foods you enjoy! What matters most for diet is to eat a well balanced healthy diet full of vegetables and low fat protein, and to not miss meals.   Chocolate, other sweets ALL cheeses except cottage and cream cheese Dairy products, yogurt, sour cream, ice cream Liver Meat extracts (Bovril, Marmite, meat tenderizers) Meats or fish which have undergone aging, fermenting, pickling or smoking. These include: Hotdogs,salami,Lox,sausage, mortadellas,smoked salmon, pepperoni, Pickled herring Pods of broad bean (English beans, Chinese pea pods, [place] (fava) beans, lima and navy beans Ripe avocado, ripe banana Yeast extracts or active yeast preparations such as Brewer's or  Fleishman's (commercial bakes goods are permitted) Tomato based foods, pizza (lasagna, etc.)   MSG (monosodium glutamate) is disguised as many things; look for these common aliases: Monopotassium glutamate Autolysed yeast Hydrolysed protein Sodium caseinate "flavorings" "all natural preservatives Nutrasweet   Avoid all other foods that convincingly provoke headaches.   Resources: The Dizzy Bluford Aid Your Headache Diet, migrainestrong.com  https://zamora-andrews.com/   Caffeine and Migraine For patients that have migraine, caffeine intake more than 3 days per week can lead to dependency and increased migraine frequency. I would recommend cutting back on your caffeine intake as best you can. The recommended amount of caffeine is 200-300 mg daily, although migraine patients may experience dependency at even lower doses. While you may notice an increase in headache temporarily, cutting back will be helpful for headaches in the long run. For more information on caffeine and migraine, visit: https://americanmigrainefoundation.org/resource-library/caffeine-and-migraine/   Headache Prevention Strategies:   1. Maintain a headache diary; learn to identify and avoid triggers.  - This can be a simple note where you log when you had a headache, associated symptoms, and medications used - There are several smartphone apps developed to help track migraines: Migraine Buddy, Migraine Monitor, Curelator N1-Headache App   Common triggers include: Emotional triggers:  Emotional/Upset family or friends Emotional/Upset occupation Business reversal/success Anticipation anxiety Crisis-serious Post-crisis periodNew job/position   Physical triggers: Vacation Day Weekend Strenuous Exercise High Altitude Location New Move Menstrual Day Physical Illness Oversleep/Not enough sleep Weather changes Light: Photophobia or light sesnitivity treatment involves  a balance between desensitization and reduction in overly strong input. Use dark polarized glasses outside, but not inside. Avoid bright or fluorescent light, but do not dim environment to the point that going into a normally lit room hurts. Consider FL-41 tint lenses, which reduce the most irritating wavelengths without blocking too much light.  These can be obtained at axonoptics.com or theraspecs.com Foods: see list above.   2. Limit use of acute treatments (over-the-counter medications, triptans, etc.) to no more than 2 days per week or 10 days per month to prevent medication overuse headache (rebound headache).     3. Follow a regular schedule (including weekends and holidays): Don't skip meals. Eat a balanced diet. 8 hours of sleep nightly. Minimize stress. Exercise 30 minutes per day. Being overweight is associated with a 5 times increased risk of chronic migraine. Keep well hydrated and drink 6-8 glasses of water per day.   4. Initiate non-pharmacologic measures at the earliest onset of your headache. Rest and quiet environment. Relax and reduce stress. Breathe2Relax is a free app that can instruct you on    some simple relaxtion and breathing techniques. Http://Dawnbuse.com is a    free website that provides teaching videos on relaxation.  Also, there are  many apps that   can be downloaded for "mindful" relaxation.  An app called YOGA NIDRA will help walk you through mindfulness. Another app called Calm can be downloaded to give you a structured mindfulness guide with daily reminders and skill development. Headspace for guided meditation Mindfulness Based Stress Reduction Online Course: www.palousemindfulness.com Cold compresses.   5. Don't wait!! Take the maximum allowable dosage of prescribed medication at the first sign of migraine.   6. Compliance:  Take prescribed medication regularly as directed and at the first sign of a migraine.   7. Communicate:  Call your physician when  problems arise, especially if your headaches change, increase in frequency/severity, or become associated with neurological symptoms (weakness, numbness, slurred speech, etc.). Proceed to emergency room if you experience new or worsening symptoms or symptoms do not resolve, if you have new neurologic symptoms or if headache is severe, or for any concerning symptom.   8. Headache/pain management therapies: Consider various complementary methods, including medication, behavioral therapy, psychological counselling, biofeedback, massage therapy, acupuncture, dry needling, and other modalities.  Such measures may reduce the need for medications. Counseling for pain management, where patients learn to function and ignore/minimize their pain, seems to work very well.   9. Recommend changing family's attention and focus away from patient's headaches. Instead, emphasize daily activities. If first question of day is 'How are your headaches/Do you have a headache today?', then patient will constantly think about headaches, thus making them worse. Goal is to re-direct attention away from headaches, toward daily activities and other distractions.   10. Helpful Websites: www.AmericanHeadacheSociety.org PatentHood.ch www.headaches.org TightMarket.nl www.achenet.org

## 2023-07-17 NOTE — Telephone Encounter (Signed)
1 yr f/u scheduled

## 2023-07-17 NOTE — Progress Notes (Unsigned)
 No chief complaint on file.   HISTORY OF PRESENT ILLNESS:  07/17/23 ALL:  Frances Horn returns for follow up for migraines. She was last seen 06/2022. We switched Amovig to Ajovy  and added rizatriptan . Cyclobenzaprine  and Nurtec continued PRN. Since,   06/23/2022 ALL:  Frances Horn is a 48 y.o. female here today for follow up for migraines. She was seen in consult with Dr Rush 11/2021. Amovig continued and she was started on Nurtec and cyclobenzaprine  for abortive therapy. Since, she reports having about 15 headache days a month. She estimated about 4-5 migraines a month. Nurtec has not been as effective. It does help relieve pain some but not completely. Cyclobenzaprine  does help with neck tension. She admits she could stand to drink more water.    HISTORY (copied from Dr Merna previous note)  Medical co-morbidities: asthma, anxiety, depression, GERD   The patient presents for evaluation of headaches which began several years ago. They have fluctuated over the years and had improved from 2019 until this year. Migraines were daily at one pont, but they have improved in frequency since starting Aimovig . She currently has one migraine per week. Migraines are associated with photophobia, phonophobia, and nausea. They can last for several hours at a time.   Reports significant tension in her neck and shoulders which contributes to her headaches. States she used to do neck PT and take muscle relaxers which did help.   She currently takes ibuprofen as needed, which is not very effective. She has tried multiple triptans without improvement. Her PCP provided Nurtec samples which do help reduce her headaches.     Headache History: Triggers: stress Aura: no Location: unilateral retro-orbital, holocephalic Quality/Description: throbbing, stabbing Associated Symptoms:             Photophobia: yes             Phonophobia: yes             Nausea: yes Worse with activity?: yes Duration of  headaches: several hours   Migraine days per month: 4 Headache free days per month: 26   Current Treatment: Abortive ibuprofen   Preventative Aimovig  140 mg monthly   Prior Therapies                                 Rescue: Ibuprofen Toradol  - helps Zofran  4 mg PRN Imitrex 100 mg PRN Maxalt  10 mg PRN Nurtec  - helped   Prevention: Topamax/Qudexy 50 mg BID Citalopram Aimovig  140 mg monthly   REVIEW OF SYSTEMS: Out of a complete 14 system review of symptoms, the patient complains only of the following symptoms, headaches and all other reviewed systems are negative.   ALLERGIES: Allergies  Allergen Reactions   Erythromycin Other (See Comments)    Stomach cramps   Nabumetone Other (See Comments)    Couldn't breath     HOME MEDICATIONS: Outpatient Medications Prior to Visit  Medication Sig Dispense Refill   albuterol  (VENTOLIN  HFA) 108 (90 BASE) MCG/ACT inhaler Inhale 2 puffs into the lungs every 6 (six) hours as needed for wheezing or shortness of breath. Reported on 04/09/2015     ALPRAZolam (XANAX) 0.25 MG tablet Take 0.25 mg by mouth at bedtime as needed for anxiety.     calcium carbonate (TUMS - DOSED IN MG ELEMENTAL CALCIUM) 500 MG chewable tablet Chew 2 tablets by mouth daily. Reported on 04/09/2015     cetirizine (ZYRTEC) 10  MG tablet Take 10 mg by mouth daily as needed for allergies. Reported on 04/09/2015     Citalopram Hydrobromide (CELEXA PO) Take 1 tablet by mouth daily.     cyclobenzaprine  (FLEXERIL ) 10 MG tablet Take 1 tablet (10 mg total) by mouth 3 (three) times daily as needed for muscle spasms. 90 tablet 6   diphenhydramine -acetaminophen  (TYLENOL  PM) 25-500 MG TABS Take 2 tablets by mouth at bedtime as needed. Reported on 04/09/2015     EPINEPHrine  (EPIPEN  2-PAK) 0.3 mg/0.3 mL IJ SOAJ injection Inject 0.3 mg into the muscle once.     estradiol (ESTRACE) 1 MG tablet Take by mouth.     Fremanezumab -vfrm (AJOVY ) 225 MG/1.5ML SOAJ Inject 225 mg into the skin every  30 (thirty) days. 4.5 mL 3   furosemide (LASIX) 10 MG/ML solution Take 20 mg by mouth daily as needed.     omeprazole (PRILOSEC) 40 MG capsule Take 40 mg by mouth daily.     ondansetron  (ZOFRAN ) 4 MG tablet Take 4 mg by mouth every 8 (eight) hours as needed for nausea or vomiting. Reported on 04/09/2015     pseudoephedrine-guaifenesin (MUCINEX D) 60-600 MG per tablet Take 1 tablet by mouth every 12 (twelve) hours. Reported on 04/09/2015     Rimegepant Sulfate (NURTEC) 75 MG TBDP Take 1 tablet (75 mg total) by mouth as needed. 8 tablet 3   rizatriptan  (MAXALT ) 10 MG tablet Take 1 tablet (10 mg total) by mouth as needed for migraine. May repeat in 2 hours if needed 8 tablet 11   No facility-administered medications prior to visit.     PAST MEDICAL HISTORY: Past Medical History:  Diagnosis Date   Anxiety    Asthma    GERD (gastroesophageal reflux disease)    Migraine      PAST SURGICAL HISTORY: Past Surgical History:  Procedure Laterality Date   CATARACT EXTRACTION Right 11/17/2021     FAMILY HISTORY: Family History  Problem Relation Age of Onset   High blood pressure Mother    Diabetes Mother    Alzheimer's disease Father    Heart Problems Father    High Cholesterol Father    Stroke Father    High blood pressure Brother    Heart Problems Maternal Uncle    Stroke Maternal Uncle    Bladder Cancer Maternal Uncle    Cancer Paternal Aunt    Heart Problems Maternal Grandmother    Stroke Maternal Grandmother    Heart attack Maternal Grandfather    Cancer Paternal Grandmother    Cancer Paternal Grandfather      SOCIAL HISTORY: Social History   Socioeconomic History   Marital status: Single    Spouse name: Not on file   Number of children: Not on file   Years of education: Not on file   Highest education level: Not on file  Occupational History   Not on file  Tobacco Use   Smoking status: Never   Smokeless tobacco: Never  Substance and Sexual Activity   Alcohol use:  No   Drug use: No   Sexual activity: Not on file  Other Topics Concern   Not on file  Social History Narrative   Not on file   Social Drivers of Health   Financial Resource Strain: Not on file  Food Insecurity: Not on file  Transportation Needs: Not on file  Physical Activity: Not on file  Stress: Not on file  Social Connections: Not on file  Intimate Partner Violence: Not on file  PHYSICAL EXAM  There were no vitals filed for this visit.  There is no height or weight on file to calculate BMI.  Generalized: Well developed, in no acute distress  Cardiology: normal rate and rhythm, no murmur auscultated  Respiratory: clear to auscultation bilaterally    Neurological examination  Mentation: Alert oriented to time, place, history taking. Follows all commands speech and language fluent Cranial nerve II-XII: Pupils were equal round reactive to light. Extraocular movements were full, visual field were full on confrontational test. Facial sensation and strength were normal. Head turning and shoulder shrug  were normal and symmetric. Motor: The motor testing reveals 5 over 5 strength of all 4 extremities. Good symmetric motor tone is noted throughout.  Gait and station: Gait is normal.    DIAGNOSTIC DATA (LABS, IMAGING, TESTING) - I reviewed patient records, labs, notes, testing and imaging myself where available.  Lab Results  Component Value Date   WBC 13.5 (H) 12/24/2010   HGB 14.5 12/24/2010   HCT 41.7 12/24/2010   MCV 89.9 12/24/2010   PLT 288 12/24/2010      Component Value Date/Time   NA 134 (L) 12/24/2010 2022   K 3.8 12/24/2010 2022   CL 97 12/24/2010 2022   CO2 27 12/24/2010 2022   GLUCOSE 119 (H) 12/24/2010 2022   BUN 15 12/24/2010 2022   CREATININE 0.70 12/24/2010 2022   CALCIUM 9.2 12/24/2010 2022   GFRNONAA >90 12/24/2010 2022   GFRAA >90 12/24/2010 2022   No results found for: CHOL, HDL, LDLCALC, LDLDIRECT, TRIG, CHOLHDL No results  found for: HGBA1C No results found for: VITAMINB12 No results found for: TSH      No data to display               No data to display           ASSESSMENT AND PLAN  48 y.o. year old female  has a past medical history of Anxiety, Asthma, GERD (gastroesophageal reflux disease), and Migraine. here with    No diagnosis found.  Makynlee Kressin continues to have about 15 headache days a month and 4-5 migraines. I will switch her to Ajovy  for prevention. We will restart rizatriptan  for abortive therapy as this seems to have worked well in the past. She may continue Nurtec and cyclobenzaprine  for abortive therapy. Appropriate dosing and possible side effects reviewed. Healthy lifestyle habits encouraged. She will follow up with PCP as directed. She will return to see me in 6 months, sooner if needed. She verbalizes understanding and agreement with this plan.   No orders of the defined types were placed in this encounter.    No orders of the defined types were placed in this encounter.    Greig Forbes, MSN, FNP-C 07/17/2023, 3:36 PM  [facility] [address], [address] [place], [place] [address] [phone]

## 2023-07-18 ENCOUNTER — Telehealth: Payer: Self-pay | Admitting: *Deleted

## 2023-07-18 ENCOUNTER — Ambulatory Visit: Admitting: Family Medicine

## 2023-07-18 ENCOUNTER — Encounter: Payer: Self-pay | Admitting: Family Medicine

## 2023-07-18 VITALS — BP 122/72 | HR 69 | Ht 61.0 in | Wt 241.6 lb

## 2023-07-18 DIAGNOSIS — G43019 Migraine without aura, intractable, without status migrainosus: Secondary | ICD-10-CM | POA: Diagnosis not present

## 2023-07-18 MED ORDER — EMGALITY 120 MG/ML ~~LOC~~ SOAJ: 120.0000 mg | SUBCUTANEOUS | 3 refills | Status: DC

## 2023-07-18 MED ORDER — UBRELVY 100 MG PO TABS: 100.0000 mg | ORAL_TABLET | Freq: Every day | ORAL | 11 refills | Status: AC | PRN

## 2023-07-18 NOTE — Telephone Encounter (Signed)
 Received fax from [facility] that PA Ubrelvy  needed. Covermymeds key: [id number]

## 2023-07-18 NOTE — Telephone Encounter (Signed)
 Received fax that PA Emgality  needed. Covermymeds key: [id number]

## 2023-07-19 ENCOUNTER — Telehealth: Payer: Self-pay

## 2023-07-19 ENCOUNTER — Other Ambulatory Visit (HOSPITAL_COMMUNITY): Payer: Self-pay

## 2023-07-19 DIAGNOSIS — G43019 Migraine without aura, intractable, without status migrainosus: Secondary | ICD-10-CM

## 2023-07-19 NOTE — Telephone Encounter (Signed)
 PA request has been Received. New Encounter has been or will be created for follow up. For additional info see Pharmacy Prior Auth telephone encounter from 07/19/2023.

## 2023-07-19 NOTE — Telephone Encounter (Signed)
 Pharmacy Patient Advocate Encounter  Received notification from [employer] that Prior Authorization for Ubrelvy  100MG  tabletshas been APPROVED from 07/19/2023 to 10/19/2023. Ran test claim, Copay is $0. This test claim was processed through [facility]- copay amounts may vary at other pharmacies due to pharmacy/plan contracts, or as the patient moves through the different stages of their insurance plan.   PA #/Case ID/Reference #: PA Case ID #: [id number]

## 2023-07-19 NOTE — Telephone Encounter (Addendum)
 Error

## 2023-07-19 NOTE — Telephone Encounter (Signed)
 Pharmacy Patient Advocate Encounter   Received notification from Physician's Office that prior authorization for Ubrelvy  100MG  tablets is required/requested.   Insurance verification completed.   The patient is insured through [facility] .   Per test claim: PA required; PA submitted to above mentioned insurance via CoverMyMeds Key/confirmation #/EOC [id number] Status is pending

## 2023-07-19 NOTE — Telephone Encounter (Signed)
 Pharmacy Patient Advocate Encounter   Received notification from Physician's Office that prior authorization for Emgality  120MG /ML auto-injectors (migraine) is required/requested.   Insurance verification completed.   The patient is insured through [facility] .   Per test claim:  AIMOVIG , AJOVY , QULIPTA, NURTEC is preferred by the insurance.  If suggested medication is appropriate, Please send in a new RX and discontinue this one. If not, please advise as to why it's not appropriate so that we may request a Prior Authorization. Please note, some preferred medications may still require a PA.  If the suggested medications have not been trialed and there are no contraindications to their use, the PA will not be submitted, as it will not be approved.  THIS IS A PLAN EXCLUSION MEANING THEY WILL NOT COVER THE MED EVEN WITH A PA. I believe the PT has tried everything except Qulipta.

## 2023-07-19 NOTE — Telephone Encounter (Signed)
 Amy- can she change to [facility] as her preventative med since Emgality  a plan exclusion? Looks like she has tried Aimovig , Ajovy  and Nurtec her current rescue med.

## 2023-07-20 MED ORDER — AJOVY 225 MG/1.5ML ~~LOC~~ SOAJ: 1.5000 mL | SUBCUTANEOUS | 11 refills | Status: DC

## 2023-07-20 NOTE — Telephone Encounter (Signed)
 Called pt at  [phone]. Relayed message from Amy. Pt prefers to not take a daily medication. Wants to stay on Ajovy  even though she is having breakthrough migraines. She will continue this and change to Ubrelvy  as the rescue med. She will send updates to Amy via mychart on how things go. She will also read mychart message Amy sent her yesterday for recommendations. E-scribed rx Ajovy  to [facility] per pt request.

## 2023-07-26 DIAGNOSIS — Z1331 Encounter for screening for depression: Secondary | ICD-10-CM | POA: Diagnosis not present

## 2023-07-26 DIAGNOSIS — Z0000 Encounter for general adult medical examination without abnormal findings: Secondary | ICD-10-CM | POA: Diagnosis not present

## 2023-08-15 DIAGNOSIS — Z1231 Encounter for screening mammogram for malignant neoplasm of breast: Secondary | ICD-10-CM | POA: Diagnosis not present

## 2023-10-04 DIAGNOSIS — R55 Syncope and collapse: Secondary | ICD-10-CM | POA: Diagnosis not present

## 2023-10-08 DIAGNOSIS — J45901 Unspecified asthma with (acute) exacerbation: Secondary | ICD-10-CM | POA: Diagnosis not present

## 2023-10-08 DIAGNOSIS — R059 Cough, unspecified: Secondary | ICD-10-CM | POA: Diagnosis not present

## 2023-10-08 DIAGNOSIS — J209 Acute bronchitis, unspecified: Secondary | ICD-10-CM | POA: Diagnosis not present

## 2023-10-08 DIAGNOSIS — R0981 Nasal congestion: Secondary | ICD-10-CM | POA: Diagnosis not present

## 2023-10-11 DIAGNOSIS — R0602 Shortness of breath: Secondary | ICD-10-CM | POA: Diagnosis not present

## 2023-10-11 DIAGNOSIS — J208 Acute bronchitis due to other specified organisms: Secondary | ICD-10-CM | POA: Diagnosis not present

## 2023-10-11 DIAGNOSIS — R053 Chronic cough: Secondary | ICD-10-CM | POA: Diagnosis not present

## 2023-10-11 DIAGNOSIS — B9689 Other specified bacterial agents as the cause of diseases classified elsewhere: Secondary | ICD-10-CM | POA: Diagnosis not present

## 2023-10-20 DIAGNOSIS — R55 Syncope and collapse: Secondary | ICD-10-CM | POA: Diagnosis not present

## 2023-10-24 ENCOUNTER — Telehealth: Payer: Self-pay

## 2023-10-24 NOTE — Telephone Encounter (Signed)
 Called pt at [phone]. LVM

## 2023-10-24 NOTE — Telephone Encounter (Signed)
 Hello Prescriber!  We are in the process of submitting a prior authorization for your patient for Ubrelvy . We are reaching out for clinical guidance for the following information to complete the request: Plan requiring documentation of clinical benefit since starting therapy.   Thank you! Pharmacy Team

## 2023-10-25 ENCOUNTER — Telehealth: Payer: Self-pay | Admitting: Pharmacist

## 2023-10-25 MED ORDER — QULIPTA 60 MG PO TABS: 1.0000 | ORAL_TABLET | ORAL | 3 refills | Status: AC

## 2023-10-25 NOTE — Telephone Encounter (Signed)
 FYI Pt called returning call , Informed Pt Nurse Message , Pt states hat  Medication is working  , it the most effective  and taking the headaches away in a couple hours .  She hasn't  had any issues with taking this  Medication .I will make  telephone note .  .. Also Pt states she wants to see MD to Discuss what's causing  Headache ,

## 2023-10-25 NOTE — Telephone Encounter (Signed)
 Pharmacy Patient Advocate Encounter   Received notification from Patient Pharmacy that prior authorization for Qulipta 60MG  tablets is required/requested.   Insurance verification completed.   The patient is insured through [facility].   Per test claim: PA required; PA submitted to above mentioned insurance via Latent Key/confirmation #/EOC [id number] Status is pending

## 2023-10-25 NOTE — Telephone Encounter (Signed)
 Called pt back. Reports headaches worse since this past September. Reports daily headaches. Confirmed she is taking Ajovy  inj monthly as prescribed (tolerating well) and Ubrelvy  prn. Felt r/t to sinus infection/pressure at first. After this was resolved, still having daily headaches. Also reports blacking out three times at work starting in September. Unsure if passing out or seizure events. Episodes last 15-20 min. Wakes up at desk in chair. Wakes up and feels dizzy/disoriented. Denies biting tongue. No personal hx seizures. Father had hx seizures d/t TBI. Most recent episode yesterday.   PCP did labs that looked ok per pt. They referred to cardiologist. Cardiologist did ECHO 10/20/23, results pending. Also ordered 30 day heart monitor, working on Therapist, occupational.  Mother had heart issues. Grandmother had irregular heartbeat/pacemaker. Father side-hx heart attacks. Older brother had heart attack/open heart surgery. Mother had sleep apnea/placed on CPAP.   Also referred to pulmonologist to do sleep study. She is waiting to get scheduled to see them.  Reports excessive fatigue during the day. Thought it was menopause and placed on hormone replacement but still experiencing fatigue.   States BP stable, no concerns per PCP. Blood sugar ok.    Aware I will send to Amy to review and call back to let her know Amy's recommendation from a neurological standpoint.

## 2023-10-25 NOTE — Telephone Encounter (Signed)
 Call to patient, she is agreeable to Qulipta 60 mg daily and will get PCP referral if warranted. Last ajovy  injection 10/20. Aware to start qulipta 11/20.

## 2023-10-26 NOTE — Telephone Encounter (Signed)
 Pharmacy Patient Advocate Encounter  Received notification from [employer] that Prior Authorization for Qulipta 60MG  tablets has been APPROVED from 10/25/2023 to 04/24/2024   PA #/Case ID/Reference #: [id number]

## 2023-10-27 DIAGNOSIS — R55 Syncope and collapse: Secondary | ICD-10-CM | POA: Diagnosis not present

## 2023-10-30 ENCOUNTER — Other Ambulatory Visit: Payer: Self-pay | Admitting: *Deleted

## 2023-10-30 MED ORDER — RIZATRIPTAN BENZOATE 10 MG PO TABS: 10.0000 mg | ORAL_TABLET | ORAL | 3 refills | Status: AC | PRN

## 2023-10-30 NOTE — Telephone Encounter (Signed)
 Last seen on 07/18/23 Follow up scheduled on 03/12/24

## 2023-11-07 DIAGNOSIS — R400 Somnolence: Secondary | ICD-10-CM | POA: Diagnosis not present

## 2023-11-07 DIAGNOSIS — R0683 Snoring: Secondary | ICD-10-CM | POA: Diagnosis not present

## 2023-11-07 DIAGNOSIS — J301 Allergic rhinitis due to pollen: Secondary | ICD-10-CM | POA: Diagnosis not present

## 2023-11-07 DIAGNOSIS — R5383 Other fatigue: Secondary | ICD-10-CM | POA: Diagnosis not present

## 2023-11-23 ENCOUNTER — Telehealth: Payer: Self-pay

## 2023-11-23 ENCOUNTER — Other Ambulatory Visit (HOSPITAL_COMMUNITY): Payer: Self-pay

## 2023-11-23 NOTE — Telephone Encounter (Signed)
 Pharmacy Patient Advocate Encounter   Received notification from CoverMyMeds that prior authorization for Ubrelvy  is required/requested.   Insurance verification completed.   The patient is insured through [employer]   Per test claim: PA required; PA submitted to above mentioned insurance via Latent Key/confirmation #/EOC [id number] Status is pending

## 2023-11-24 ENCOUNTER — Other Ambulatory Visit (HOSPITAL_COMMUNITY): Payer: Self-pay

## 2023-11-24 NOTE — Telephone Encounter (Signed)
 Pharmacy Patient Advocate Encounter  Received notification from [employer] that Prior Authorization for Ubrelvy  has been APPROVED from 11/23/2023 to 11/22/2025. Ran test claim, Copay is $0. This test claim was processed through [facility]- copay amounts may vary at other pharmacies due to pharmacy/plan contracts, or as the patient moves through the different stages of their insurance plan.   PA #/Case ID/Reference #: [id number]

## 2023-11-25 DIAGNOSIS — G4733 Obstructive sleep apnea (adult) (pediatric): Secondary | ICD-10-CM | POA: Diagnosis not present

## 2023-11-25 DIAGNOSIS — R0683 Snoring: Secondary | ICD-10-CM | POA: Diagnosis not present

## 2023-11-27 DIAGNOSIS — J301 Allergic rhinitis due to pollen: Secondary | ICD-10-CM | POA: Diagnosis not present

## 2023-11-27 DIAGNOSIS — J4530 Mild persistent asthma, uncomplicated: Secondary | ICD-10-CM | POA: Diagnosis not present

## 2023-11-27 DIAGNOSIS — G4733 Obstructive sleep apnea (adult) (pediatric): Secondary | ICD-10-CM | POA: Diagnosis not present

## 2023-12-08 DIAGNOSIS — G4733 Obstructive sleep apnea (adult) (pediatric): Secondary | ICD-10-CM | POA: Diagnosis not present

## 2023-12-08 DIAGNOSIS — R5383 Other fatigue: Secondary | ICD-10-CM | POA: Diagnosis not present

## 2024-01-01 ENCOUNTER — Encounter: Payer: Self-pay | Admitting: Family Medicine

## 2024-01-01 DIAGNOSIS — G43019 Migraine without aura, intractable, without status migrainosus: Secondary | ICD-10-CM

## 2024-01-03 ENCOUNTER — Other Ambulatory Visit: Payer: Self-pay

## 2024-01-03 DIAGNOSIS — M545 Low back pain, unspecified: Secondary | ICD-10-CM | POA: Diagnosis not present

## 2024-01-03 DIAGNOSIS — K5900 Constipation, unspecified: Secondary | ICD-10-CM | POA: Diagnosis not present

## 2024-01-03 DIAGNOSIS — K760 Fatty (change of) liver, not elsewhere classified: Secondary | ICD-10-CM | POA: Diagnosis not present

## 2024-01-03 DIAGNOSIS — M5442 Lumbago with sciatica, left side: Secondary | ICD-10-CM | POA: Diagnosis not present

## 2024-01-03 MED ORDER — EMGALITY 120 MG/ML ~~LOC~~ SOAJ: 120.0000 mg | SUBCUTANEOUS | 5 refills | Status: AC

## 2024-01-03 NOTE — Telephone Encounter (Signed)
 Emgality  prescription placed, no loading dose needed.

## 2024-01-05 ENCOUNTER — Telehealth: Payer: Self-pay

## 2024-01-05 ENCOUNTER — Other Ambulatory Visit (HOSPITAL_COMMUNITY): Payer: Self-pay

## 2024-01-05 NOTE — Telephone Encounter (Signed)
 Pharmacy Patient Advocate Encounter   Received notification from CoverMyMeds that prior authorization for Emgality  is required/requested.   Insurance verification completed.   The patient is insured through [facility].   Per test claim: PA required; PA submitted to above mentioned insurance via Latent Key/confirmation #/EOC [id number] Status is pending  PA was denied in 2025 due to PT must try Qulipta -per recent message from pT they never picked up and tried the Qulipta . PA may get denied.

## 2024-01-08 NOTE — Telephone Encounter (Signed)
 Pharmacy Patient Advocate Encounter  Received notification from [employer] that Prior Authorization for Emgality  has been APPROVED from 01/05/2024 to 07/04/2024   PA #/Case ID/Reference #: [id number]

## 2024-02-14 ENCOUNTER — Ambulatory Visit: Admitting: Family Medicine

## 2024-03-12 ENCOUNTER — Ambulatory Visit: Admitting: Family Medicine
